# Patient Record
Sex: Male | Born: 1985 | Race: White | Hispanic: No | Marital: Married | Smoking: Current every day smoker
Health system: Southern US, Community
[De-identification: ages and names within clinical notes are randomized; demographics above are authoritative.]

---
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
## 2008-04-17 ENCOUNTER — Inpatient Hospital Stay (HOSPITAL_COMMUNITY): Admission: AD | Admit: 2008-04-17 | Discharge: 2008-04-20 | Payer: Self-pay

## 2008-04-17 ENCOUNTER — Encounter: Payer: Self-pay | Admitting: Emergency Medicine

## 2008-05-18 ENCOUNTER — Encounter: Admission: RE | Admit: 2008-05-18 | Discharge: 2008-05-18 | Payer: Self-pay | Admitting: Neurological Surgery

## 2008-06-21 ENCOUNTER — Encounter: Admission: RE | Admit: 2008-06-21 | Discharge: 2008-06-21 | Payer: Self-pay | Admitting: Neurological Surgery

## 2008-07-01 ENCOUNTER — Emergency Department: Payer: Self-pay | Admitting: Internal Medicine

## 2008-08-24 ENCOUNTER — Encounter
Admission: RE | Admit: 2008-08-24 | Discharge: 2008-08-24 | Payer: Self-pay | Admitting: Physical Medicine & Rehabilitation
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
## 2011-02-09 LAB — CBC
MCHC: 34.1 g/dL (ref 30.0–36.0)
MCV: 96.3 fL (ref 78.0–100.0)
Platelets: 164 10*3/uL (ref 150–400)
Platelets: 212 10*3/uL (ref 150–400)
RBC: 4.43 MIL/uL (ref 4.22–5.81)
RDW: 12.7 % (ref 11.5–15.5)
RDW: 13.1 % (ref 11.5–15.5)
WBC: 7.7 10*3/uL (ref 4.0–10.5)

## 2011-02-09 LAB — BASIC METABOLIC PANEL
BUN: 10 mg/dL (ref 6–23)
BUN: 3 mg/dL — ABNORMAL LOW (ref 6–23)
CO2: 25 mEq/L (ref 19–32)
Calcium: 8.9 mg/dL (ref 8.4–10.5)
Calcium: 9.5 mg/dL (ref 8.4–10.5)
Creatinine, Ser: 0.79 mg/dL (ref 0.4–1.5)
Creatinine, Ser: 0.88 mg/dL (ref 0.4–1.5)
GFR calc Af Amer: >60 mL/min (ref >60)
GFR calc non Af Amer: >60 mL/min (ref >60)
Glucose, Bld: 114 mg/dL — ABNORMAL HIGH (ref 70–99)

## 2011-02-09 LAB — URINALYSIS, ROUTINE W REFLEX MICROSCOPIC
Bilirubin Urine: NEGATIVE
Nitrite: NEGATIVE
Specific Gravity, Urine: <1.005 — ABNORMAL LOW (ref 1.005–1.030)
pH: 6 (ref 5.0–8.0)

## 2011-02-09 LAB — DIFFERENTIAL
Basophils Absolute: 0 10*3/uL (ref 0.0–0.1)
Basophils Relative: 0 % (ref 0–1)
Eosinophils Absolute: 0 10*3/uL (ref 0.0–0.7)
Monocytes Relative: 4 % (ref 3–12)
Neutro Abs: 16.5 10*3/uL — ABNORMAL HIGH (ref 1.7–7.7)
Neutrophils Relative %: 91 % — ABNORMAL HIGH (ref 43–77)

## 2011-02-09 LAB — TYPE AND SCREEN
ABO/RH(D): O POS
Antibody Screen: NEGATIVE

## 2011-02-09 LAB — URINE MICROSCOPIC-ADD ON

## 2012-02-05 ENCOUNTER — Emergency Department (HOSPITAL_COMMUNITY)
Admission: EM | Admit: 2012-02-05 | Discharge: 2012-02-05 | Disposition: A | Discharge status: Home / Self Care | Payer: Self-pay | Attending: Emergency Medicine | Admitting: Emergency Medicine

## 2012-02-05 ENCOUNTER — Encounter (HOSPITAL_COMMUNITY): Payer: Self-pay | Admitting: Emergency Medicine

## 2012-02-05 DIAGNOSIS — R059 Cough, unspecified: Secondary | ICD-10-CM | POA: Insufficient documentation

## 2012-02-05 DIAGNOSIS — J028 Acute pharyngitis due to other specified organisms: Secondary | ICD-10-CM

## 2012-02-05 DIAGNOSIS — J029 Acute pharyngitis, unspecified: Secondary | ICD-10-CM | POA: Insufficient documentation

## 2012-02-05 DIAGNOSIS — R05 Cough: Secondary | ICD-10-CM

## 2012-02-05 DIAGNOSIS — B9789 Other viral agents as the cause of diseases classified elsewhere: Secondary | ICD-10-CM | POA: Insufficient documentation

## 2012-02-05 LAB — RAPID STREP SCREEN (MED CTR MEBANE ONLY): Streptococcus, Group A Screen (Direct): NEGATIVE

## 2012-02-05 MED ORDER — AZITHROMYCIN 250 MG PO TABS: ORAL_TABLET | ORAL | Status: DC

## 2012-02-05 NOTE — ED Notes (Signed)
Pt alert, arrives from home, c/o sore throat, fever, body aches, onset last week, resp even unlabored, skin pwd

## 2012-02-05 NOTE — ED Provider Notes (Signed)
History     CSN: [id number]  Arrival date & time 02/05/12  2048   First MD Initiated Contact with Patient 02/05/12 2207      Chief Complaint  Patient presents with  . Sore Throat    (Consider location/radiation/quality/duration/timing/severity/associated sxs/prior treatment) HPI Comments: Jared Lawson 26 y.o. male   The chief complaint is: Patient presents with:   Sore Throat   The patient has medical history significant for:   History reviewed. No pertinent past medical history.  Patient presents with a one week history of unproductive cough, sore throat, myalgias, and subjective fever X 1 week. Patient states that a small child in his household was recently diagnosed with strep and bronchitis. Patient states that he has tried OTC throat lozenges, tylenol, and Dayquil without relief of symptoms. Denies chills or diaphoresis. Denies SOB or wheezing.      The history is provided by the patient. No language interpreter was used.    History reviewed. No pertinent past medical history.  History reviewed. No pertinent past surgical history.  No family history on file.  History  Substance Use Topics  . Smoking status: Current Some Day Smoker  . Smokeless tobacco: Not on file  . Alcohol Use: No      Review of Systems  Constitutional: Positive for fever. Negative for chills and diaphoresis.  HENT: Positive for sore throat.   Respiratory: Positive for cough. Negative for shortness of breath and wheezing.   Musculoskeletal: Positive for myalgias.    Allergies  Review of patient's allergies indicates no known allergies.  Home Medications   Current Outpatient Rx  Name Route Sig Dispense Refill  . HEXYLRESORCINOL 2.4 MG MT LOZG Mouth/Throat Use as directed 1 lozenge in the mouth or throat every 4 (four) hours as needed. For sore throat    . TYLENOL COLD HEAD CONGESTION PO Oral Take 2 tablets by mouth every 6 (six) hours as needed. For pain    . DAYQUIL PO Oral  Take 2 capsules by mouth every 6 (six) hours as needed. For cold symptoms      BP 124/72  Pulse 89  Temp 99 F (37.2 C) (Oral)  Resp 16  Wt 190 lb (86.183 kg)  SpO2 99%  Physical Exam  Nursing note and vitals reviewed. Constitutional: He appears well-developed and well-nourished. No distress.  HENT:  Head: Normocephalic and atraumatic.  Mouth/Throat: Oropharynx is clear and moist. No oropharyngeal exudate.       Oropharnyx mildly erythematous with signs of post nasal drip.   No tenderness to palpation of frontal or maxillary sinuses.  Eyes: Conjunctivae normal and EOM are normal. No scleral icterus.  Neck: Normal range of motion. Neck supple.  Cardiovascular: Normal rate and regular rhythm.   Pulmonary/Chest: Effort normal and breath sounds normal. He has no wheezes.  Abdominal: Soft. Bowel sounds are normal. There is no tenderness.  Lymphadenopathy:    He has no cervical adenopathy.  Neurological: He is alert.  Skin: Skin is warm and dry.    ED Course  Procedures (including critical care time)   Labs Reviewed  RAPID STREP SCREEN   Results for orders placed during the hospital encounter of 02/05/12  RAPID STREP SCREEN      Component Value Range   Streptococcus, Group A Screen (Direct) NEGATIVE  NEGATIVE    No results found.   1. Sore throat (viral)   2. Cough       MDM  Patient presented with sore throat, subjective fevers,  cough, congestion, and body aches x 1 week. Patient afebrile. Patient discharged on z-pak, as it has been more than one week. Patient also given instructions on supportive care for his sore throat. No red flags for strep pharyngitis or peritonsillar abscess. Return precautions given verbally and in discharge summary.        Pixie Casino, PA-C 02/06/12 [phone]

## 2012-02-06 NOTE — ED Provider Notes (Signed)
Medical screening examination/treatment/procedure(s) were performed by non-physician practitioner and as supervising physician I was immediately available for consultation/collaboration. Devoria Albe, MD, Armando Gang   Ward Givens, MD 02/06/12 [phone]

## 2015-01-11 ENCOUNTER — Encounter: Payer: Self-pay | Admitting: *Deleted

## 2015-01-11 ENCOUNTER — Emergency Department
Admission: EM | Admit: 2015-01-11 | Discharge: 2015-01-12 | Disposition: A | Discharge status: Home / Self Care | Payer: Self-pay | Attending: Emergency Medicine | Admitting: Emergency Medicine

## 2015-01-11 DIAGNOSIS — S91331A Puncture wound without foreign body, right foot, initial encounter: Secondary | ICD-10-CM | POA: Insufficient documentation

## 2015-01-11 DIAGNOSIS — S31139A Puncture wound of abdominal wall without foreign body, unspecified quadrant without penetration into peritoneal cavity, initial encounter: Secondary | ICD-10-CM | POA: Insufficient documentation

## 2015-01-11 DIAGNOSIS — L03314 Cellulitis of groin: Secondary | ICD-10-CM

## 2015-01-11 DIAGNOSIS — W5911XA Bitten by nonvenomous snake, initial encounter: Secondary | ICD-10-CM | POA: Insufficient documentation

## 2015-01-11 DIAGNOSIS — T63001A Toxic effect of unspecified snake venom, accidental (unintentional), initial encounter: Secondary | ICD-10-CM

## 2015-01-11 DIAGNOSIS — Y9301 Activity, walking, marching and hiking: Secondary | ICD-10-CM | POA: Insufficient documentation

## 2015-01-11 DIAGNOSIS — Z720 Tobacco use: Secondary | ICD-10-CM | POA: Insufficient documentation

## 2015-01-11 DIAGNOSIS — Y998 Other external cause status: Secondary | ICD-10-CM | POA: Insufficient documentation

## 2015-01-11 DIAGNOSIS — S9001XA Contusion of right ankle, initial encounter: Secondary | ICD-10-CM | POA: Insufficient documentation

## 2015-01-11 DIAGNOSIS — Z23 Encounter for immunization: Secondary | ICD-10-CM | POA: Insufficient documentation

## 2015-01-11 DIAGNOSIS — Y9289 Other specified places as the place of occurrence of the external cause: Secondary | ICD-10-CM | POA: Insufficient documentation

## 2015-01-11 MED ORDER — ONDANSETRON HCL 4 MG/2ML IJ SOLN
INTRAMUSCULAR | Status: AC
  Administered 2015-01-11: 4 mg via INTRAVENOUS
  Filled 2015-01-11: qty 2

## 2015-01-11 MED ORDER — MORPHINE SULFATE (PF) 2 MG/ML IV SOLN
2.0000 mg | Freq: Once | INTRAVENOUS | Status: AC
  Administered 2015-01-11: 2 mg via INTRAVENOUS

## 2015-01-11 MED ORDER — SODIUM CHLORIDE 0.9 % IV BOLUS (SEPSIS)
1000.0000 mL | Freq: Once | INTRAVENOUS | Status: AC
Start: 2015-01-11 — End: 2015-01-12
  Administered 2015-01-11: 1000 mL via INTRAVENOUS

## 2015-01-11 MED ORDER — ONDANSETRON HCL 4 MG/2ML IJ SOLN
4.0000 mg | Freq: Once | INTRAMUSCULAR | Status: AC
  Administered 2015-01-11: 4 mg via INTRAVENOUS

## 2015-01-11 MED ORDER — SULFAMETHOXAZOLE-TRIMETHOPRIM 800-160 MG PO TABS
1.0000 | ORAL_TABLET | Freq: Once | ORAL | Status: AC
  Administered 2015-01-11: 1 via ORAL
  Filled 2015-01-11: qty 1

## 2015-01-11 MED ORDER — MORPHINE SULFATE (PF) 2 MG/ML IV SOLN
INTRAVENOUS | Status: AC
  Administered 2015-01-11: 2 mg via INTRAVENOUS
  Filled 2015-01-11: qty 1
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
## 2015-01-11 NOTE — ED Notes (Signed)
Dr Manson Passey at bedside, explaining plan of care.  Pt and SO verbalized understanding.

## 2015-01-11 NOTE — ED Notes (Signed)
Initial Measuring of Right Foot:  Calf 15  Ankle 11 1/2  Ankle around heel 12  Foot 10

## 2015-01-12 ENCOUNTER — Emergency Department
Admission: EM | Admit: 2015-01-12 | Discharge: 2015-01-13 | Disposition: A | Discharge status: Home / Self Care | Payer: Self-pay | Attending: Emergency Medicine | Admitting: Emergency Medicine

## 2015-01-12 ENCOUNTER — Encounter: Payer: Self-pay | Admitting: Emergency Medicine

## 2015-01-12 DIAGNOSIS — Y998 Other external cause status: Secondary | ICD-10-CM | POA: Insufficient documentation

## 2015-01-12 DIAGNOSIS — Y9389 Activity, other specified: Secondary | ICD-10-CM | POA: Insufficient documentation

## 2015-01-12 DIAGNOSIS — S9031XA Contusion of right foot, initial encounter: Secondary | ICD-10-CM | POA: Insufficient documentation

## 2015-01-12 DIAGNOSIS — W5911XA Bitten by nonvenomous snake, initial encounter: Secondary | ICD-10-CM | POA: Insufficient documentation

## 2015-01-12 DIAGNOSIS — Z79899 Other long term (current) drug therapy: Secondary | ICD-10-CM | POA: Insufficient documentation

## 2015-01-12 DIAGNOSIS — Y9289 Other specified places as the place of occurrence of the external cause: Secondary | ICD-10-CM | POA: Insufficient documentation

## 2015-01-12 DIAGNOSIS — T63001D Toxic effect of unspecified snake venom, accidental (unintentional), subsequent encounter: Secondary | ICD-10-CM

## 2015-01-12 DIAGNOSIS — S91331D Puncture wound without foreign body, right foot, subsequent encounter: Secondary | ICD-10-CM | POA: Insufficient documentation

## 2015-01-12 DIAGNOSIS — Z720 Tobacco use: Secondary | ICD-10-CM | POA: Insufficient documentation

## 2015-01-12 LAB — APTT: aPTT: 32 seconds (ref 24–36)

## 2015-01-12 LAB — COMPREHENSIVE METABOLIC PANEL
ALT: 28 U/L (ref 17–63)
ANION GAP: 11 (ref 5–15)
AST: 27 U/L (ref 15–41)
Albumin: 5.3 g/dL — ABNORMAL HIGH (ref 3.5–5.0)
Alkaline Phosphatase: 99 U/L (ref 38–126)
BUN: 12 mg/dL (ref 6–20)
CALCIUM: 9.9 mg/dL (ref 8.9–10.3)
CHLORIDE: 100 mmol/L — AB (ref 101–111)
CO2: 30 mmol/L (ref 22–32)
CREATININE: 1.06 mg/dL (ref 0.61–1.24)
Glucose, Bld: 106 mg/dL — ABNORMAL HIGH (ref 65–99)
Potassium: 3.9 mmol/L (ref 3.5–5.1)
SODIUM: 141 mmol/L (ref 135–145)
Total Bilirubin: 1 mg/dL (ref 0.3–1.2)
Total Protein: 8.7 g/dL — ABNORMAL HIGH (ref 6.5–8.1)

## 2015-01-12 LAB — CBC
HCT: 48.3 % (ref 40.0–52.0)
Hemoglobin: 16.4 g/dL (ref 13.0–18.0)
MCH: 32.5 pg (ref 26.0–34.0)
MCHC: 33.9 g/dL (ref 32.0–36.0)
MCV: 95.8 fL (ref 80.0–100.0)
PLATELETS: 262 10*3/uL (ref 150–440)
RBC: 5.04 MIL/uL (ref 4.40–5.90)
RDW: 13.2 % (ref 11.5–14.5)
WBC: 9.9 10*3/uL (ref 3.8–10.6)

## 2015-01-12 MED ORDER — MORPHINE SULFATE (PF) 4 MG/ML IV SOLN
4.0000 mg | Freq: Once | INTRAVENOUS | Status: AC
  Administered 2015-01-12: 4 mg via INTRAVENOUS
  Filled 2015-01-12: qty 1

## 2015-01-12 MED ORDER — MORPHINE SULFATE (PF) 2 MG/ML IV SOLN
2.0000 mg | Freq: Once | INTRAVENOUS | Status: AC
  Administered 2015-01-12: 2 mg via INTRAVENOUS

## 2015-01-12 MED ORDER — TETANUS-DIPHTHERIA TOXOIDS TD 5-2 LFU IM INJ
0.5000 mL | INJECTION | Freq: Once | INTRAMUSCULAR | Status: AC
  Administered 2015-01-12: 0.5 mL via INTRAMUSCULAR
  Filled 2015-01-12: qty 0.5

## 2015-01-12 MED ORDER — NICOTINE 21 MG/24HR TD PT24
21.0000 mg | MEDICATED_PATCH | Freq: Once | TRANSDERMAL | Status: DC
  Administered 2015-01-12: 21 mg via TRANSDERMAL
  Filled 2015-01-12: qty 1

## 2015-01-12 MED ORDER — OXYCODONE-ACETAMINOPHEN 10-325 MG PO TABS: 1.0000 | ORAL_TABLET | Freq: Four times a day (QID) | ORAL | Status: AC | PRN

## 2015-01-12 MED ORDER — KETOROLAC TROMETHAMINE 30 MG/ML IJ SOLN
30.0000 mg | Freq: Once | INTRAMUSCULAR | Status: AC
  Administered 2015-01-12: 30 mg via INTRAVENOUS
  Filled 2015-01-12: qty 1

## 2015-01-12 MED ORDER — MORPHINE SULFATE (PF) 2 MG/ML IV SOLN
INTRAVENOUS | Status: AC
  Administered 2015-01-12: 2 mg via INTRAVENOUS
  Filled 2015-01-12: qty 1

## 2015-01-12 MED ORDER — OXYCODONE-ACETAMINOPHEN 5-325 MG PO TABS
1.0000 | ORAL_TABLET | Freq: Once | ORAL | Status: AC
  Administered 2015-01-12: 1 via ORAL
  Filled 2015-01-12: qty 1

## 2015-01-12 MED ORDER — OXYCODONE-ACETAMINOPHEN 5-325 MG PO TABS: 1.0000 | ORAL_TABLET | ORAL | Status: DC | PRN

## 2015-01-12 MED ORDER — HYDROMORPHONE HCL 1 MG/ML IJ SOLN
1.0000 mg | Freq: Once | INTRAMUSCULAR | Status: AC
  Administered 2015-01-12: 1 mg via INTRAVENOUS
  Filled 2015-01-12: qty 1

## 2015-01-12 MED ORDER — SULFAMETHOXAZOLE-TRIMETHOPRIM 800-160 MG PO TABS: 1.0000 | ORAL_TABLET | Freq: Two times a day (BID) | ORAL | Status: AC

## 2015-01-12 NOTE — Discharge Instructions (Signed)
Cellulitis Cellulitis is an infection of the skin and the tissue beneath it. The infected area is usually red and tender. Cellulitis occurs most often in the arms and lower legs.  CAUSES  Cellulitis is caused by bacteria that enter the skin through cracks or cuts in the skin. The most common types of bacteria that cause cellulitis are staphylococci and streptococci. SIGNS AND SYMPTOMS   Redness and warmth.  Swelling.  Tenderness or pain.  Fever. DIAGNOSIS  Your health care provider can usually determine what is wrong based on a physical exam. Blood tests may also be done. TREATMENT  Treatment usually involves taking an antibiotic medicine. HOME CARE INSTRUCTIONS   Take your antibiotic medicine as directed by your health care provider. Finish the antibiotic even if you start to feel better.  Keep the infected arm or leg elevated to reduce swelling.  Apply a warm cloth to the affected area up to 4 times per day to relieve pain.  Take medicines only as directed by your health care provider.  Keep all follow-up visits as directed by your health care provider. SEEK MEDICAL CARE IF:   You notice red streaks coming from the infected area.  Your red area gets larger or turns dark in color.  Your bone or joint underneath the infected area becomes painful after the skin has healed.  Your infection returns in the same area or another area.  You notice a swollen bump in the infected area.  You develop Shelden symptoms.  You have a fever. SEEK IMMEDIATE MEDICAL CARE IF:   You feel very sleepy.  You develop vomiting or diarrhea.  You have a general ill feeling (malaise) with muscle aches and pains. MAKE SURE YOU:   Understand these instructions.  Will watch your condition.  Will get help right away if you are not doing well or get worse. Document Released: 01/31/2005 Document Revised: 09/07/2013 Document Reviewed: 07/09/2011 [facility] Patient Information 2015 [place], [place].  This information is not intended to replace advice given to you by your health care provider. Make sure you discuss any questions you have with your health care provider.  Snake Bite Snakes may be either venomous (containing poison) or nonvenomous (nonpoisonous). A nonvenomous snake bite will cause trauma or a wound to the skin and possibly the deeper tissues. A venomous snake will also cause a traumatic wound, but more importantly, it may have injected venom into the wound. Snake bite venom can be extremely serious and even deadly. One type of venom may cause major skin, tissue and muscle damage, and failure of normal blood clotting. This may cause extreme swelling and pain of the affected area. Another type of venom can affect the brain and nervous system and may cause death. The treatment for venomous snake bite may require the use of antivenom medicine. If you are unsure if your bite is from a venomous snake, you MUST seek immediate medical attention. YOU MIGHT NEED A TETANUS SHOT NOW IF:  You have no idea when you had the last one.  You have never had a tetanus shot before.  The bite broke your skin. If you need a tetanus shot, and you decide not to get one, there is a rare chance of getting tetanus. Sickness from tetanus can be serious. HOME CARE INSTRUCTIONS  A snake bit you and caused a skin wound. It may or may not have been venomous. If the snake was venomous, a small amount of venom may have been injected into your skin.  Keep the bite area clean and dry.  Keep the extremity elevated above the level of the heart for the next 48 hours.  Wash the bite area 3 times daily with soap and water or an antiseptic. Apply an adhesive or gauze bandage to the bite area.  If you develop blistering of any type at the site of the bite, protect the blisters from breaking. Do not attempt to open it.  If you were given a tetanus shot, your arm may get swollen, red and warm at the shot site. This is a  common response to the injection. SEEK IMMEDIATE MEDICAL CARE IF:   You develop symptoms of poisoning including increased pain, redness, swelling, blood blisters or purple spots in the bite area, nausea, vomiting, numbness, tingling, excessive sweating, breathing difficulty, blurred vision, feelings of lightheadedness, or feeling faint. If you develop symptoms of poisoning, you MUST seek immediate medical attention.  The bite becomes infected. Symptoms may include redness, swelling, pain, tenderness, pus, red streaks running from the wound, or an oral temperature above 102 F (38.9 C), not controlled by medicine.  Your condition or wound becomes worse. MAKE SURE YOU:   Understand these instructions.  Will watch your condition.  Will get help right away if you are not doing well or get worse. Document Released: 04/20/2000 Document Revised: 07/16/2011 Document Reviewed: 09/14/2009 [facility] Patient Information 2015 [place], [place]. This information is not intended to replace advice given to you by your health care provider. Make sure you discuss any questions you have with your health care provider.

## 2015-01-12 NOTE — ED Provider Notes (Signed)
[facility] Emergency Department Provider Note    ____________________________________________  Time seen: 1145  I have reviewed the triage vital signs and the nursing notes.   HISTORY  Chief Complaint Snake Bite   History limited by: Not Limited   HPI Jared Lawson is a 29 y.o. male who presents to the emergency department today with concerns for pain and swelling to his right foot. The patient was seen in the emergency department roughly 24 hours ago after having a snake bite to his right foot. He was seen in the emergency department given pain medications and antibiotics to go home with. He was not given CroFab. Patient states that he has had continued pain and he thinks some increased swelling of his foot today. He denies any fevers, shortness of breath, chest pain, nausea vomiting or diarrhea.     History reviewed. No pertinent past medical history.  There are no active problems to display for this patient.   History reviewed. No pertinent past surgical history.  Current Outpatient Rx  Name  Route  Sig  Dispense  Refill  . azithromycin (ZITHROMAX Z-PAK) 250 MG tablet      2 po day one, then 1 daily x 4 days   5 tablet   0   . HEXYLRESORCINOL, ANTISEPTIC, (SUCRETS) 2.4 MG LOZG   Mouth/Throat   Use as directed 1 lozenge in the mouth or throat every 4 (four) hours as needed. For sore throat         . oxyCODONE-acetaminophen (PERCOCET/ROXICET) 5-325 MG per tablet   Oral   Take 1 tablet by mouth every 4 (four) hours as needed for severe pain.   20 tablet   0   . Phenyleph-CPM-DM-APAP (TYLENOL COLD HEAD CONGESTION PO)   Oral   Take 2 tablets by mouth every 6 (six) hours as needed. For pain         . Pseudoephedrine-APAP-DM (DAYQUIL PO)   Oral   Take 2 capsules by mouth every 6 (six) hours as needed. For cold symptoms         . sulfamethoxazole-trimethoprim (BACTRIM DS,SEPTRA DS) 800-160 MG per tablet   Oral   Take 1 tablet by  mouth 2 (two) times daily.   14 tablet   0     Allergies Review of patient's allergies indicates no known allergies.  No family history on file.  Social History Social History  Substance Use Topics  . Smoking status: Current Some Day Smoker  . Smokeless tobacco: None  . Alcohol Use: No    Review of Systems  Constitutional: Negative for fever. Cardiovascular: Negative for chest pain. Respiratory: Negative for shortness of breath. Gastrointestinal: Negative for abdominal pain, vomiting and diarrhea. Genitourinary: Negative for dysuria. Musculoskeletal: Positive for right foot pain and swelling. Skin: Negative for rash. Neurological: Negative for headaches, focal weakness or numbness.   10-point ROS otherwise negative.  ____________________________________________   PHYSICAL EXAM:  VITAL SIGNS: ED Triage Vitals  Enc Vitals Group     BP 01/12/15 2316 137/55 mmHg     Pulse Rate 01/12/15 2316 98     Resp 01/12/15 2316 18     Temp --      Temp Source 01/12/15 2316 Oral     SpO2 01/12/15 2316 98 %     Weight 01/12/15 2316 170 lb (77.111 kg)     Height 01/12/15 2316  (1.803 m)     Head Cir --      Peak Flow --  Pain Score 01/12/15 2316 8   Constitutional: Alert and oriented. Well appearing and in no distress. Eyes: Conjunctivae are normal. PERRL. Normal extraocular movements. ENT   Head: Normocephalic and atraumatic.   Nose: No congestion/rhinnorhea.   Mouth/Throat: Mucous membranes are moist.   Neck: No stridor. Hematological/Lymphatic/Immunilogical: No cervical lymphadenopathy. Cardiovascular: Normal rate, regular rhythm.  No murmurs, rubs, or gallops. Respiratory: Normal respiratory effort without tachypnea nor retractions. Breath sounds are clear and equal bilaterally. No wheezes/rales/rhonchi. Gastrointestinal: Soft and nontender. No distention.  Genitourinary: Deferred Musculoskeletal: Patient with small area of ecchymosis to the  mid right instep. It is tender to palpation. No discharge or obvious puncture wounds appreciated. Compartments are soft in the right lower leg. Neurologic:  Normal speech and language. No gross focal neurologic deficits are appreciated. Speech is normal.  Skin:  Skin is warm, dry and intact. No rash noted. Psychiatric: Mood and affect are normal. Speech and behavior are normal. Patient exhibits appropriate insight and judgment.  ____________________________________________    LABS (pertinent positives/negatives)  None  ____________________________________________   EKG  None  ____________________________________________    RADIOLOGY  None  ____________________________________________   PROCEDURES  Procedure(s) performed: None  Critical Care performed: No  ____________________________________________   INITIAL IMPRESSION / ASSESSMENT AND PLAN / ED COURSE  Pertinent labs & imaging results that were available during my care of the patient were reviewed by me and considered in my medical decision making (see chart for details).  Patient presents to the emergency department today with continued pain and swelling to the right foot after a snake bite yesterday. On exam patient with mild ecchymosis. Compartments are soft no signs of tissue necrosis. No signs of overlying infection. At this point I think continued treatment with pain medication and antibiotics.  ____________________________________________   FINAL CLINICAL IMPRESSION(S) / ED DIAGNOSES  Snake Bite  Phineas Semen, MD 01/12/15 [id number]

## 2015-01-12 NOTE — ED Provider Notes (Signed)
[facility] Emergency Department Provider Note  ____________________________________________  Time seen: 11:00 PM  I have reviewed the triage vital signs and the nursing notes.   HISTORY  Chief Complaint Snake Bite     HPI Jared Lawson is a 29 y.o. male presents with history of possible snake bite to the right foot. Patient states roughly 30 minutes before presenting to the emergency department while walking outside [employer] he felt a sharp pain to the medial posterior aspect of his right heel. Since that time the patient states that he has noted markedly swollen and ecchymoses. Current pain score is 10 out of 10.Patient currently denies any nausea vomiting dizziness no chest pain no shortness of breath.     Past medical history None  There are no active problems to display for this patient.   Past surgical history None  Current Outpatient Rx  Name  Route  Sig  Dispense  Refill  . azithromycin (ZITHROMAX Z-PAK) 250 MG tablet      2 po day one, then 1 daily x 4 days   5 tablet   0   . HEXYLRESORCINOL, ANTISEPTIC, (SUCRETS) 2.4 MG LOZG   Mouth/Throat   Use as directed 1 lozenge in the mouth or throat every 4 (four) hours as needed. For sore throat         . Phenyleph-CPM-DM-APAP (TYLENOL COLD HEAD CONGESTION PO)   Oral   Take 2 tablets by mouth every 6 (six) hours as needed. For pain         . Pseudoephedrine-APAP-DM (DAYQUIL PO)   Oral   Take 2 capsules by mouth every 6 (six) hours as needed. For cold symptoms           Allergies No known drug allergies No family history on file.  Social History Social History  Substance Use Topics  . Smoking status: Current Some Day Smoker  . Smokeless tobacco: None  . Alcohol Use: No    Review of Systems  Constitutional: Negative for fever. Eyes: Negative for visual changes. ENT: Negative for sore throat. Cardiovascular: Negative for chest pain. Respiratory: Negative for shortness  of breath. Gastrointestinal: Negative for abdominal pain, vomiting and diarrhea. Genitourinary: Negative for dysuria. Musculoskeletal: Negative for back pain. Positive for right foot pain and swelling. Skin: Negative for rash. Neurological: Negative for headaches, focal weakness or numbness.   10-point ROS otherwise negative.  ____________________________________________   PHYSICAL EXAM:  VITAL SIGNS: ED Triage Vitals  Enc Vitals Group     BP 01/11/15 2241 115/77 mmHg     Pulse Rate 01/11/15 2241 107     Resp 01/11/15 2241 20     Temp 01/11/15 2241 98.1 F (36.7 C)     Temp Source 01/11/15 2241 Oral     SpO2 01/11/15 2241 99 %     Weight 01/11/15 2241 170 lb (77.111 kg)     Height 01/11/15 2241  (1.803 m)     Head Cir --      Peak Flow --      Pain Score 01/11/15 2242 8     Pain Loc --      Pain Edu? --      Excl. in GC? --     * Constitutional: Alert and oriented. Well appearing and in no distress. Eyes: Conjunctivae are normal. PERRL. Normal extraocular movements. ENT   Head: Normocephalic and atraumatic.   Nose: No congestion/rhinnorhea.   Mouth/Throat: Mucous membranes are moist.   Neck: No  stridor. Hematological/Lymphatic/Immunilogical: No cervical lymphadenopathy. Cardiovascular: Normal rate, regular rhythm. Normal and symmetric distal pulses are present in all extremities. No murmurs, rubs, or gallops. Respiratory: Normal respiratory effort without tachypnea nor retractions. Breath sounds are clear and equal bilaterally. No wheezes/rales/rhonchi. Gastrointestinal: Soft and nontender. No distention. There is no CVA tenderness. Genitourinary: deferred Musculoskeletal: Pain with palpation, ecchymoses and swelling noted to the medial aspect of the right heel. No joint effusions.  No lower extremity tenderness nor edema. Neurologic:  Normal speech and language. No gross focal neurologic deficits are appreciated. Speech is normal.  Skin:  Small 2 x  2 centimeter area of erythema left groin. Right medial calcaneal erythema and ecchymoses with overlying swelling extending to the ankle. Psychiatric: Mood and affect are normal. Speech and behavior are normal. Patient exhibits appropriate insight and judgment.  ____________________________________________    LABS (pertinent positives/negatives)  Labs Reviewed  COMPREHENSIVE METABOLIC PANEL - Abnormal; Notable for the following:    Chloride 100 (*)    Glucose, Bld 106 (*)    Total Protein 8.7 (*)    Albumin 5.3 (*)    All other components within normal limits  CBC  APTT       INITIAL IMPRESSION / ASSESSMENT AND PLAN / ED COURSE  Pertinent labs & imaging results that were available during my care of the patient were reviewed by me and considered in my medical decision making (see chart for details).  She received multiple doses of IV morphine in emergency department with pain control. Of note patient swelling did not progress in fact area around the ankle actually decreased in size while in the emergency department. I reviewed the patient's labs which were unremarkable from the standpoint of a snake bite. Patient was advised to return to emergency department for any increased swelling pain nausea vomiting or any other emergency medical concerns.  ____________________________________________   FINAL CLINICAL IMPRESSION(S) / ED DIAGNOSES  Final diagnoses:  Snake bite, accidental or unintentional, initial encounter  Cellulitis of groin      Darci Current, MD 01/14/15 [id number]

## 2015-01-12 NOTE — ED Notes (Signed)
Measure of foot: Ankle 11 3/8 Ankle around heel over top of foot 13 Foot circumference at arch 10

## 2015-01-12 NOTE — ED Notes (Signed)
Dr Manson Passey at bedside, reassessing R foot.

## 2015-01-12 NOTE — ED Notes (Addendum)
Pt to triage via w/c with no distress noted; pt seen here last night for snake bite; c/o increased swelling to right foot/ankle and lower leg; discoloration noted to instep of foot with increased pain; +PP, as compared to measurements noted on leg from last night, approx 1/2" larger circumference noted around calf and ankle (10" mid foot, 12" across top of foot, 12" ankle and 15.5" to calf); pt denies any other c/o--denies CP and SOB

## 2015-01-12 NOTE — Discharge Instructions (Signed)
Please seek medical attention for any high fevers, chest pain, shortness of breath, change in behavior, persistent vomiting, bloody stool or any other [place] or concerning symptoms. ° ° °Snake Bite °Snakes may be either venomous (containing poison) or nonvenomous (nonpoisonous). A nonvenomous snake bite will cause trauma or a wound to the skin and possibly the deeper tissues. A venomous snake will also cause a traumatic wound, but more importantly, it may have injected venom into the wound. Snake bite venom can be extremely serious and even deadly. One type of venom may cause major skin, tissue and muscle damage, and failure of normal blood clotting. This may cause extreme swelling and pain of the affected area. Another type of venom can affect the brain and nervous system and may cause death. The treatment for venomous snake bite may require the use of antivenom medicine. If you are unsure if your bite is from a venomous snake, you MUST seek immediate medical attention. °YOU MIGHT NEED A TETANUS SHOT NOW IF: °· You have no idea when you had the last one. °· You have never had a tetanus shot before. °· The bite broke your skin. °If you need a tetanus shot, and you decide not to get one, there is a rare chance of getting tetanus. Sickness from tetanus can be serious. °HOME CARE INSTRUCTIONS  °A snake bit you and caused a skin wound. It may or may not have been venomous. If the snake was venomous, a small amount of venom may have been injected into your skin. °· Keep the bite area clean and dry. °· Keep the extremity elevated above the level of the heart for the next 48 hours. °· Wash the bite area 3 times daily with soap and water or an antiseptic. Apply an adhesive or gauze bandage to the bite area. °· If you develop blistering of any type at the site of the bite, protect the blisters from breaking. Do not attempt to open it. °· If you were given a tetanus shot, your arm may get swollen, red and warm at the shot site.  This is a common response to the injection. °SEEK IMMEDIATE MEDICAL CARE IF:  °· You develop symptoms of poisoning including increased pain, redness, swelling, blood blisters or purple spots in the bite area, nausea, vomiting, numbness, tingling, excessive sweating, breathing difficulty, blurred vision, feelings of lightheadedness, or feeling faint. If you develop symptoms of poisoning, you MUST seek immediate medical attention. °· The bite becomes infected. Symptoms may include redness, swelling, pain, tenderness, pus, red streaks running from the wound, or an oral temperature above 102° F (38.9° C), not controlled by medicine. °· Your condition or wound becomes worse. °MAKE SURE YOU:  °· Understand these instructions. °· Will watch your condition. °· Will get help right away if you are not doing well or get worse. °Document Released: 04/20/2000 Document Revised: 07/16/2011 Document Reviewed: 09/14/2009 °ExitCare® Patient Information ©2015 [employer]. This information is not intended to replace advice given to you by your health care provider. Make sure you discuss any questions you have with your health care provider. ° °

## 2015-01-17 ENCOUNTER — Emergency Department: Payer: Self-pay

## 2015-01-17 ENCOUNTER — Encounter: Payer: Self-pay | Admitting: Emergency Medicine

## 2015-01-17 ENCOUNTER — Emergency Department
Admission: EM | Admit: 2015-01-17 | Discharge: 2015-01-17 | Disposition: A | Discharge status: Home / Self Care | Payer: Self-pay | Attending: Emergency Medicine | Admitting: Emergency Medicine

## 2015-01-17 DIAGNOSIS — R609 Edema, unspecified: Secondary | ICD-10-CM | POA: Insufficient documentation

## 2015-01-17 DIAGNOSIS — X58XXXD Exposure to other specified factors, subsequent encounter: Secondary | ICD-10-CM | POA: Insufficient documentation

## 2015-01-17 DIAGNOSIS — Z720 Tobacco use: Secondary | ICD-10-CM | POA: Insufficient documentation

## 2015-01-17 DIAGNOSIS — S8011XD Contusion of right lower leg, subsequent encounter: Secondary | ICD-10-CM | POA: Insufficient documentation

## 2015-01-17 DIAGNOSIS — T63061D Toxic effect of venom of other North and South American snake, accidental (unintentional), subsequent encounter: Secondary | ICD-10-CM | POA: Insufficient documentation

## 2015-01-17 DIAGNOSIS — T63001D Toxic effect of unspecified snake venom, accidental (unintentional), subsequent encounter: Secondary | ICD-10-CM

## 2015-01-17 LAB — COMPREHENSIVE METABOLIC PANEL
ALK PHOS: 89 U/L (ref 38–126)
ALT: 35 U/L (ref 17–63)
ANION GAP: 11 (ref 5–15)
AST: 31 U/L (ref 15–41)
Albumin: 4.6 g/dL (ref 3.5–5.0)
BILIRUBIN TOTAL: 0.9 mg/dL (ref 0.3–1.2)
BUN: 15 mg/dL (ref 6–20)
CALCIUM: 9.2 mg/dL (ref 8.9–10.3)
CO2: 28 mmol/L (ref 22–32)
CREATININE: 1.17 mg/dL (ref 0.61–1.24)
Chloride: 98 mmol/L — ABNORMAL LOW (ref 101–111)
Glucose, Bld: 104 mg/dL — ABNORMAL HIGH (ref 65–99)
Potassium: 3.9 mmol/L (ref 3.5–5.1)
SODIUM: 137 mmol/L (ref 135–145)
TOTAL PROTEIN: 7.6 g/dL (ref 6.5–8.1)

## 2015-01-17 LAB — PROTIME-INR
INR: 1.03
PROTHROMBIN TIME: 13.7 s (ref 11.4–15.0)

## 2015-01-17 LAB — CBC WITH DIFFERENTIAL/PLATELET
Basophils Absolute: 0.1 10*3/uL (ref 0–0.1)
Basophils Relative: 1 %
EOS ABS: 0.2 10*3/uL (ref 0–0.7)
Eosinophils Relative: 2 %
HCT: 46.3 % (ref 40.0–52.0)
HEMOGLOBIN: 15.7 g/dL (ref 13.0–18.0)
LYMPHS ABS: 1.4 10*3/uL (ref 1.0–3.6)
LYMPHS PCT: 18 %
MCH: 32.4 pg (ref 26.0–34.0)
MCHC: 33.8 g/dL (ref 32.0–36.0)
MCV: 95.9 fL (ref 80.0–100.0)
MONOS PCT: 5 %
Monocytes Absolute: 0.4 10*3/uL (ref 0.2–1.0)
NEUTROS PCT: 74 %
Neutro Abs: 5.9 10*3/uL (ref 1.4–6.5)
Platelets: 221 10*3/uL (ref 150–440)
RBC: 4.84 MIL/uL (ref 4.40–5.90)
RDW: 12.9 % (ref 11.5–14.5)
WBC: 7.9 10*3/uL (ref 3.8–10.6)

## 2015-01-17 LAB — CK: Total CK: 59 U/L (ref 49–397)

## 2015-01-17 MED ORDER — IBUPROFEN 800 MG PO TABS: 800.0000 mg | ORAL_TABLET | Freq: Three times a day (TID) | ORAL | Status: DC | PRN

## 2015-01-17 MED ORDER — HYDROMORPHONE HCL 1 MG/ML IJ SOLN
1.0000 mg | Freq: Once | INTRAMUSCULAR | Status: AC
  Administered 2015-01-17: 1 mg via INTRAVENOUS

## 2015-01-17 MED ORDER — HYDROMORPHONE HCL 1 MG/ML IJ SOLN
1.0000 mg | Freq: Once | INTRAMUSCULAR | Status: AC
  Administered 2015-01-17: 1 mg via INTRAVENOUS
  Filled 2015-01-17: qty 1

## 2015-01-17 MED ORDER — OXYCODONE-ACETAMINOPHEN 5-325 MG PO TABS: 1.0000 | ORAL_TABLET | Freq: Four times a day (QID) | ORAL | Status: DC | PRN

## 2015-01-17 MED ORDER — HYDROMORPHONE HCL 1 MG/ML IJ SOLN
INTRAMUSCULAR | Status: AC
  Administered 2015-01-17: 1 mg via INTRAVENOUS
  Filled 2015-01-17: qty 1

## 2015-01-17 NOTE — Discharge Instructions (Signed)
Snake Bite °Snakes may be either venomous (containing poison) or nonvenomous (nonpoisonous). A nonvenomous snake bite will cause trauma or a wound to the skin and possibly the deeper tissues. A venomous snake will also cause a traumatic wound, but more importantly, it may have injected venom into the wound. Snake bite venom can be extremely serious and even deadly. One type of venom may cause major skin, tissue and muscle damage, and failure of normal blood clotting. This may cause extreme swelling and pain of the affected area. Another type of venom can affect the brain and nervous system and may cause death. The treatment for venomous snake bite may require the use of antivenom medicine. If you are unsure if your bite is from a venomous snake, you MUST seek immediate medical attention. °YOU MIGHT NEED A TETANUS SHOT NOW IF: °· You have no idea when you had the last one. °· You have never had a tetanus shot before. °· The bite broke your skin. °If you need a tetanus shot, and you decide not to get one, there is a rare chance of getting tetanus. Sickness from tetanus can be serious. °HOME CARE INSTRUCTIONS  °A snake bit you and caused a skin wound. It may or may not have been venomous. If the snake was venomous, a small amount of venom may have been injected into your skin. °· Keep the bite area clean and dry. °· Keep the extremity elevated above the level of the heart for the next 48 hours. °· Wash the bite area 3 times daily with soap and water or an antiseptic. Apply an adhesive or gauze bandage to the bite area. °· If you develop blistering of any type at the site of the bite, protect the blisters from breaking. Do not attempt to open it. °· If you were given a tetanus shot, your arm may get swollen, red and warm at the shot site. This is a common response to the injection. °SEEK IMMEDIATE MEDICAL CARE IF:  °· You develop symptoms of poisoning including increased pain, redness, swelling, blood blisters or purple  spots in the bite area, nausea, vomiting, numbness, tingling, excessive sweating, breathing difficulty, blurred vision, feelings of lightheadedness, or feeling faint. If you develop symptoms of poisoning, you MUST seek immediate medical attention. °· The bite becomes infected. Symptoms may include redness, swelling, pain, tenderness, pus, red streaks running from the wound, or an oral temperature above 102° F (38.9° C), not controlled by medicine. °· Your condition or wound becomes worse. °MAKE SURE YOU:  °· Understand these instructions. °· Will watch your condition. °· Will get help right away if you are not doing well or get worse. °Document Released: 04/20/2000 Document Revised: 07/16/2011 Document Reviewed: 09/14/2009 °ExitCare® Patient Information ©2015 [employer]. This information is not intended to replace advice given to you by your health care provider. Make sure you discuss any questions you have with your health care provider. ° °

## 2015-01-17 NOTE — ED Provider Notes (Addendum)
[facility] Emergency Department Provider Note     Time seen: ----------------------------------------- 5:52 PM on 01/17/2015 -----------------------------------------    I have reviewed the triage vital signs and the nursing notes.   HISTORY  Chief Complaint Snake Bite    HPI Jared Lawson is a 29 y.o. male who presents to ER for follow-up concerning a snake bite. Patient was believed to been bitten by a copperhead on September 9. Patient did not receive antivenom and at that time, states the pain is been increasing as well as increasing rash in the right lower extremity. Patient denies any fevers or chills, has just worsening pain mainly associated with this rash in the right lower extremity. Pain is worse when he walks   History reviewed. No pertinent past medical history.  There are no active problems to display for this patient.   History reviewed. No pertinent past surgical history.  Allergies Review of patient's allergies indicates no known allergies.  Social History Social History  Substance Use Topics  . Smoking status: Current Some Day Smoker  . Smokeless tobacco: None  . Alcohol Use: No    Review of Systems Constitutional: Negative for fever. Eyes: Negative for visual changes. ENT: Negative for sore throat. Cardiovascular: Negative for chest pain. Respiratory: Negative for shortness of breath. Gastrointestinal: Negative for abdominal pain, vomiting and diarrhea. Genitourinary: Negative for dysuria. Musculoskeletal: Positive for right lower extremity pain worse around the right ankle Skin: Positive for rash and bruising to the right lower extremity Neurological: Negative for headaches, focal weakness or numbness.  10-point ROS otherwise negative.  ____________________________________________   PHYSICAL EXAM:  VITAL SIGNS: ED Triage Vitals  Enc Vitals Group     BP 01/17/15 1656 118/75 mmHg     Pulse Rate 01/17/15 1656  104     Resp 01/17/15 1656 18     Temp 01/17/15 1656 97.8 F (36.6 C)     Temp Source 01/17/15 1656 Oral     SpO2 01/17/15 1656 98 %     Weight 01/17/15 1656 170 lb (77.111 kg)     Height 01/17/15 1656 5\' 10"  (1.778 m)     Head Cir --      Peak Flow --      Pain Score 01/17/15 1657 7     Pain Loc --      Pain Edu? --      Excl. in GC? --     Constitutional: Alert and oriented. Well appearing and in no distress. Eyes: Conjunctivae are normal. PERRL. Normal extraocular movements. ENT   Head: Normocephalic and atraumatic.   Nose: No congestion/rhinnorhea.   Mouth/Throat: Mucous membranes are moist.   Neck: No stridor. Cardiovascular: Normal rate, regular rhythm. Normal and symmetric distal pulses are present in all extremities. No murmurs, rubs, or gallops. Respiratory: Normal respiratory effort without tachypnea nor retractions. Breath sounds are clear and equal bilaterally. No wheezes/rales/rhonchi. Gastrointestinal: Soft and nontender. No distention. No abdominal bruits.  Musculoskeletal: Markedly tenderness in the right lower extremity mostly around the right ankle and foot. There is edema of the right lower extremity up to just above the ankle. There is associated rash from the foot all the way to the hamstring area. Neurologic:  Normal speech and language. No gross focal neurologic deficits are appreciated. Speech is normal. No gait instability. Skin: Ecchymosis and possible purpura extending from the right anteromedial calf to the hamstring area on the right leg  ____________________________________________  ED COURSE:  Pertinent labs & imaging results  that were available during my care of the patient were reviewed by me and considered in my medical decision making (see chart for details). Patient with persistent pain status post crotalid envenomation. We'll check basic labs and coags, patient may need imaging of the right lower  extremity ____________________________________________    LABS (pertinent positives/negatives)  Labs Reviewed  COMPREHENSIVE METABOLIC PANEL - Abnormal; Notable for the following:    Chloride 98 (*)    Glucose, Bld 104 (*)    All other components within normal limits  CBC WITH DIFFERENTIAL/PLATELET  PROTIME-INR  CK    RADIOLOGY Images were viewed by me  Right foot x-ray FINDINGS: There is no evidence of fracture or dislocation. There is no evidence of arthropathy or other focal bone abnormality. Soft tissues are unremarkable.  IMPRESSION: Negative. ____________________________________________  FINAL ASSESSMENT AND PLAN  Snake bite, persistent pain and edema  Plan: Patient with labs and imaging as dictated above. Patient will be discharged with pain medication, encouraged to continue elevating the extremity. Return here for worsening or worrisome symptoms.   Emily Filbert, MD   Emily Filbert, MD 01/17/15 1906  Emily Filbert, MD 01/17/15 [phone]

## 2015-01-17 NOTE — ED Notes (Signed)
Pt was seen here on Tu for copperhead bite. Did not receive antivenom at that time. Pt back for pain med, increasing rash. States swelling has decreased.

## 2015-01-21 ENCOUNTER — Encounter: Payer: Self-pay | Admitting: Medical Oncology

## 2015-01-21 ENCOUNTER — Emergency Department
Admission: EM | Admit: 2015-01-21 | Discharge: 2015-01-21 | Disposition: A | Discharge status: Home / Self Care | Payer: Self-pay | Attending: Emergency Medicine | Admitting: Emergency Medicine

## 2015-01-21 DIAGNOSIS — Z720 Tobacco use: Secondary | ICD-10-CM | POA: Insufficient documentation

## 2015-01-21 DIAGNOSIS — T63001D Toxic effect of unspecified snake venom, accidental (unintentional), subsequent encounter: Secondary | ICD-10-CM

## 2015-01-21 DIAGNOSIS — T63021D Toxic effect of coral snake venom, accidental (unintentional), subsequent encounter: Secondary | ICD-10-CM | POA: Insufficient documentation

## 2015-01-21 DIAGNOSIS — Z792 Long term (current) use of antibiotics: Secondary | ICD-10-CM | POA: Insufficient documentation

## 2015-01-21 MED ORDER — OXYCODONE-ACETAMINOPHEN 5-325 MG PO TABS: 1.0000 | ORAL_TABLET | Freq: Four times a day (QID) | ORAL | Status: DC | PRN

## 2015-01-21 NOTE — ED Provider Notes (Signed)
[facility] Emergency Department Provider Note  ____________________________________________  Time seen: Approximately 12:24 PM  I have reviewed the triage vital signs and the nursing notes.   HISTORY  Chief Complaint Follow-up  No limitations  HPI Jared Lawson is a 29 y.o. male who presents to the ER for follow-up concerning his snakebite. He has been seen 3 for same complaint. He believed he was bitten by a copperhead on September 9. He did not receive any anti-inflammatory that time. He states that the pain is still present. Denies any fevers or chills. Pain is the most severe with weightbearing however reports pain at all times. He does report that the rash as well as the swelling is much improved. He has 2 days left of the steroids. In his here for pain medication refill.   History reviewed. No pertinent past medical history.  There are no active problems to display for this patient.   History reviewed. No pertinent past surgical history.  Current Outpatient Rx  Name  Route  Sig  Dispense  Refill  . azithromycin (ZITHROMAX Z-PAK) 250 MG tablet      2 po day one, then 1 daily x 4 days   5 tablet   0   . HEXYLRESORCINOL, ANTISEPTIC, (SUCRETS) 2.4 MG LOZG   Mouth/Throat   Use as directed 1 lozenge in the mouth or throat every 4 (four) hours as needed. For sore throat         . ibuprofen (ADVIL,MOTRIN) 800 MG tablet   Oral   Take 1 tablet (800 mg total) by mouth every 8 (eight) hours as needed.   30 tablet   0   . oxyCODONE-acetaminophen (PERCOCET) 10-325 MG per tablet   Oral   Take 1 tablet by mouth every 6 (six) hours as needed for pain.   15 tablet   0   . oxyCODONE-acetaminophen (ROXICET) 5-325 MG per tablet   Oral   Take 1 tablet by mouth every 6 (six) hours as needed.   20 tablet   0   . oxyCODONE-acetaminophen (ROXICET) 5-325 MG per tablet   Oral   Take 1 tablet by mouth every 6 (six) hours as needed for severe pain.   15  tablet   0   . Phenyleph-CPM-DM-APAP (TYLENOL COLD HEAD CONGESTION PO)   Oral   Take 2 tablets by mouth every 6 (six) hours as needed. For pain         . Pseudoephedrine-APAP-DM (DAYQUIL PO)   Oral   Take 2 capsules by mouth every 6 (six) hours as needed. For cold symptoms           Allergies Review of patient's allergies indicates no known allergies.  No family history on file.  Social History Social History  Substance Use Topics  . Smoking status: Current Some Day Smoker  . Smokeless tobacco: None  . Alcohol Use: No    Review of Systems Constitutional: No fever/chills Eyes: No visual changes. ENT: No sore throat. Cardiovascular: Denies chest pain. Respiratory: Denies shortness of breath. Gastrointestinal: No abdominal pain.  No nausea, no vomiting.  No diarrhea.  No constipation. Genitourinary: Negative for dysuria. Musculoskeletal: Negative for back pain. Pain in the right lower extremity worse in the ankle region. Skin: Negative for rash. Negative for gross edema. Negative for gross erythema. Neurological: Negative for headaches, focal weakness or numbness. Denies any numbness or tingling in distal right extremity.  10-point ROS otherwise negative.  ____________________________________________   PHYSICAL EXAM:  VITAL SIGNS: ED Triage Vitals  Enc Vitals Group     BP 01/21/15 1158 151/94 mmHg     Pulse Rate 01/21/15 1158 75     Resp 01/21/15 1158 18     Temp 01/21/15 1158 97.8 F (36.6 C)     Temp Source 01/21/15 1158 Oral     SpO2 01/21/15 1158 99 %     Weight 01/21/15 1158 170 lb (77.111 kg)     Height 01/21/15 1158  (1.778 m)     Head Cir --      Peak Flow --      Pain Score 01/21/15 1158 8     Pain Loc --      Pain Edu? --      Excl. in GC? --     Constitutional: Alert and oriented. Well appearing and in no acute distress. Eyes: Conjunctivae are normal. PERRL. EOMI. Head: Atraumatic. Nose: No congestion/rhinnorhea. Mouth/Throat:  Mucous membranes are moist.  Oropharynx non-erythematous. Neck: No stridor.   Hematological/Lymphatic/Immunilogical: No cervical lymphadenopathy. Cardiovascular: Normal rate, regular rhythm. Grossly normal heart sounds.  Good peripheral circulation. Respiratory: Normal respiratory effort.  No retractions. Lungs CTAB. Gastrointestinal: Soft and nontender. No distention. No abdominal bruits. No CVA tenderness. Musculoskeletal: Mild edema noted to medial aspect of right ankle. Mild erythema noted. Tender to palpation over affected area. Range of motion is limited by pain. Neurologic:  Normal speech and language. No gross focal neurologic deficits are appreciated. Skin:  Skin is warm, dry and intact. No rash noted. Psychiatric: Mood and affect are normal. Speech and behavior are normal.  ____________________________________________   LABS (all labs ordered are listed, but only abnormal results are displayed)  Labs Reviewed - No data to display ____________________________________________  EKG   ____________________________________________  RADIOLOGY   ____________________________________________   PROCEDURES  Procedure(s) performed: None  ____________________________________________   INITIAL IMPRESSION / ASSESSMENT AND PLAN / ED COURSE  Pertinent labs & imaging results that were available during my care of the patient were reviewed by me and considered in my medical decision making (see chart for details).  Patient is turning to the emergency room for follow-up for a snakebite. Significant improvement over last visit. He still has 2 days left of steroids which he has been advised to finish. He is out of pain medication and is here for refill. We will refill pain medication but have given patient counseling on how to transition from narcotic use to anti-inflammatory/Tylenol use. Patient understands and verbalizes understanding. Patient states he will comply with  same. ____________________________________________   FINAL CLINICAL IMPRESSION(S) / ED DIAGNOSES  Final diagnoses:  Snake bites, accidental or unintentional, subsequent encounter      Racheal Patches, PA-C 01/21/15 1257  Jeanmarie Plant, MD 01/21/15 [phone]

## 2015-01-21 NOTE — ED Notes (Signed)
Pt to ed for check up on his foot from a snake bite about a week ago states he can't put any weight on it at this time.

## 2015-01-21 NOTE — Discharge Instructions (Signed)
Snake Bite °Snakes may be either venomous (containing poison) or nonvenomous (nonpoisonous). A nonvenomous snake bite will cause trauma or a wound to the skin and possibly the deeper tissues. A venomous snake will also cause a traumatic wound, but more importantly, it may have injected venom into the wound. Snake bite venom can be extremely serious and even deadly. One type of venom may cause major skin, tissue and muscle damage, and failure of normal blood clotting. This may cause extreme swelling and pain of the affected area. Another type of venom can affect the brain and nervous system and may cause death. The treatment for venomous snake bite may require the use of antivenom medicine. If you are unsure if your bite is from a venomous snake, you MUST seek immediate medical attention. °YOU MIGHT NEED A TETANUS SHOT NOW IF: °· You have no idea when you had the last one. °· You have never had a tetanus shot before. °· The bite broke your skin. °If you need a tetanus shot, and you decide not to get one, there is a rare chance of getting tetanus. Sickness from tetanus can be serious. °HOME CARE INSTRUCTIONS  °A snake bit you and caused a skin wound. It may or may not have been venomous. If the snake was venomous, a small amount of venom may have been injected into your skin. °· Keep the bite area clean and dry. °· Keep the extremity elevated above the level of the heart for the next 48 hours. °· Wash the bite area 3 times daily with soap and water or an antiseptic. Apply an adhesive or gauze bandage to the bite area. °· If you develop blistering of any type at the site of the bite, protect the blisters from breaking. Do not attempt to open it. °· If you were given a tetanus shot, your arm may get swollen, red and warm at the shot site. This is a common response to the injection. °SEEK IMMEDIATE MEDICAL CARE IF:  °· You develop symptoms of poisoning including increased pain, redness, swelling, blood blisters or purple  spots in the bite area, nausea, vomiting, numbness, tingling, excessive sweating, breathing difficulty, blurred vision, feelings of lightheadedness, or feeling faint. If you develop symptoms of poisoning, you MUST seek immediate medical attention. °· The bite becomes infected. Symptoms may include redness, swelling, pain, tenderness, pus, red streaks running from the wound, or an oral temperature above 102° F (38.9° C), not controlled by medicine. °· Your condition or wound becomes worse. °MAKE SURE YOU:  °· Understand these instructions. °· Will watch your condition. °· Will get help right away if you are not doing well or get worse. °Document Released: 04/20/2000 Document Revised: 07/16/2011 Document Reviewed: 09/14/2009 °ExitCare® Patient Information ©2015 [employer]. This information is not intended to replace advice given to you by your health care provider. Make sure you discuss any questions you have with your health care provider. ° °

## 2015-01-21 NOTE — ED Notes (Signed)
Pt reports that he was seen here Tuesday for a snake bite, pt was not given anti venom but reports that he continues to have pain in area. Pt reports area looks much better now.

## 2015-08-04 ENCOUNTER — Encounter: Payer: Self-pay | Admitting: Emergency Medicine

## 2015-08-04 ENCOUNTER — Emergency Department
Admission: EM | Admit: 2015-08-04 | Discharge: 2015-08-04 | Disposition: A | Discharge status: Home / Self Care | Payer: Self-pay | Attending: Emergency Medicine | Admitting: Emergency Medicine

## 2015-08-04 DIAGNOSIS — T700XXA Otitic barotrauma, initial encounter: Secondary | ICD-10-CM | POA: Insufficient documentation

## 2015-08-04 DIAGNOSIS — F17200 Nicotine dependence, unspecified, uncomplicated: Secondary | ICD-10-CM | POA: Insufficient documentation

## 2015-08-04 MED ORDER — FLUTICASONE PROPIONATE 50 MCG/ACT NA SUSP: 1.0000 | Freq: Two times a day (BID) | NASAL | Status: DC

## 2015-08-04 MED ORDER — CETIRIZINE HCL 10 MG PO TABS: 10.0000 mg | ORAL_TABLET | Freq: Every day | ORAL | Status: DC

## 2015-08-04 NOTE — ED Notes (Signed)
See triage note   Left ear pain since this am  No fever or drainage

## 2015-08-04 NOTE — ED Provider Notes (Signed)
[facility] Emergency Department Provider Note  ____________________________________________  Time seen: Approximately 5:19 PM  I have reviewed the triage vital signs and the nursing notes.   HISTORY  Chief Complaint Otalgia    HPI Jared Lawson is a 30 y.o. male presents to emergency department complaining of left ear pain. Patient states that symptoms began this morning. Patient denies any tenderness to palpation on the external portion of his ear. He denies any headache, visual acuity changes, nasal congestion, sneezing, cough, sore throat. Patient has not tried any medications prior to arrival. He has no history of chronic sinusitis or allergic rhinitis.   History reviewed. No pertinent past medical history.  There are no active problems to display for this patient.   History reviewed. No pertinent past surgical history.  Current Outpatient Rx  Name  Route  Sig  Dispense  Refill  . azithromycin (ZITHROMAX Z-PAK) 250 MG tablet      2 po day one, then 1 daily x 4 days   5 tablet   0   . cetirizine (ZYRTEC) 10 MG tablet   Oral   Take 1 tablet (10 mg total) by mouth daily.   30 tablet   0   . fluticasone (FLONASE) 50 MCG/ACT nasal spray   Each Nare   Place 1 spray into both nostrils 2 (two) times daily.   16 g   0   . HEXYLRESORCINOL, ANTISEPTIC, (SUCRETS) 2.4 MG LOZG   Mouth/Throat   Use as directed 1 lozenge in the mouth or throat every 4 (four) hours as needed. For sore throat         . ibuprofen (ADVIL,MOTRIN) 800 MG tablet   Oral   Take 1 tablet (800 mg total) by mouth every 8 (eight) hours as needed.   30 tablet   0   . oxyCODONE-acetaminophen (PERCOCET) 10-325 MG per tablet   Oral   Take 1 tablet by mouth every 6 (six) hours as needed for pain.   15 tablet   0   . oxyCODONE-acetaminophen (ROXICET) 5-325 MG per tablet   Oral   Take 1 tablet by mouth every 6 (six) hours as needed.   20 tablet   0   .  oxyCODONE-acetaminophen (ROXICET) 5-325 MG per tablet   Oral   Take 1 tablet by mouth every 6 (six) hours as needed for severe pain.   15 tablet   0   . Phenyleph-CPM-DM-APAP (TYLENOL COLD HEAD CONGESTION PO)   Oral   Take 2 tablets by mouth every 6 (six) hours as needed. For pain         . Pseudoephedrine-APAP-DM (DAYQUIL PO)   Oral   Take 2 capsules by mouth every 6 (six) hours as needed. For cold symptoms           Allergies Review of patient's allergies indicates no known allergies.  No family history on file.  Social History Social History  Substance Use Topics  . Smoking status: Current Some Day Smoker  . Smokeless tobacco: None  . Alcohol Use: No     Review of Systems  Constitutional: No fever/chills Eyes: No visual changes. No discharge ENT: No sore throat. Denies nasal congestion. Positive for left ear pain. Cardiovascular: no chest pain. Respiratory: no cough. No SOB. Skin: Negative for rash. Neurological: Negative for headaches, focal weakness or numbness. 10-point ROS otherwise negative.  ____________________________________________   PHYSICAL EXAM:  VITAL SIGNS: ED Triage Vitals  Enc Vitals Group  BP 08/04/15 1648 140/68 mmHg     Pulse Rate 08/04/15 1648 83     Resp 08/04/15 1648 18     Temp 08/04/15 1648 98.1 F (36.7 C)     Temp src --      SpO2 08/04/15 1648 100 %     Weight 08/04/15 1648 175 lb (79.379 kg)     Height 08/04/15 1648 5\' 8"  (1.727 m)     Head Cir --      Peak Flow --      Pain Score 08/04/15 1649 8     Pain Loc --      Pain Edu? --      Excl. in GC? --      Constitutional: Alert and oriented. Well appearing and in no acute distress. Eyes: Conjunctivae are normal. PERRL. EOMI. Head: Atraumatic. ENT:      Ears: EACs are unremarkable bilaterally. TM on right is unremarkable. TM on left is mildly bulging with no air-fluid levels appreciated. No tenderness to palpation over the tragus.      Nose: No  congestion/rhinnorhea. Turbinates are mildly boggy.      Mouth/Throat: Mucous membranes are moist. Oropharynx is nonerythematous and nonedematous. No edema or erythema noted in oral cavity surrounding dentition. Neck: No stridor.   Hematological/Lymphatic/Immunilogical: No cervical lymphadenopathy. Cardiovascular: Normal rate, regular rhythm. Normal S1 and S2.  Good peripheral circulation. Respiratory: Normal respiratory effort without tachypnea or retractions. Lungs CTAB. Neurologic:  Normal speech and language. No gross focal neurologic deficits are appreciated.  Skin:  Skin is warm, dry and intact. No rash noted. Psychiatric: Mood and affect are normal. Speech and behavior are normal. Patient exhibits appropriate insight and judgement.   ____________________________________________   LABS (all labs ordered are listed, but only abnormal results are displayed)  Labs Reviewed - No data to display ____________________________________________  EKG   ____________________________________________  RADIOLOGY   No results found.  ____________________________________________    PROCEDURES  Procedure(s) performed:       Medications - No data to display   ____________________________________________   INITIAL IMPRESSION / ASSESSMENT AND PLAN / ED COURSE  Pertinent labs & imaging results that were available during my care of the patient were reviewed by me and considered in my medical decision making (see chart for details).  Patient's diagnosis is consistent with otitis media to the left.. Patient will be discharged home with prescriptions for Zyrtec and Flonase. Patient is to follow up with primary care provider or ENT if symptoms persist past this treatment course. Patient is given ED precautions to return to the ED for any worsening or Rodriguez symptoms.     ____________________________________________  FINAL CLINICAL IMPRESSION(S) / ED DIAGNOSES  Final diagnoses:   Barotitis media, initial encounter      [place] MEDICATIONS STARTED DURING THIS VISIT:  [facility] Prescriptions   CETIRIZINE (ZYRTEC) 10 MG TABLET    Take 1 tablet (10 mg total) by mouth daily.   FLUTICASONE (FLONASE) 50 MCG/ACT NASAL SPRAY    Place 1 spray into both nostrils 2 (two) times daily.        This chart was dictated using voice recognition software/Dragon. Despite best efforts to proofread, errors can occur which can change the meaning. Any change was purely unintentional.    Racheal PatchesJonathan D Danajah Birdsell, PA-C 08/04/15 1727  Jene Everyobert Kinner, MD 08/04/15 2242

## 2015-08-04 NOTE — ED Notes (Signed)
C/o left ear pain. Onset of symptoms this morning.

## 2015-08-04 NOTE — Discharge Instructions (Signed)

## 2015-11-28 ENCOUNTER — Emergency Department
Admission: EM | Admit: 2015-11-28 | Discharge: 2015-11-28 | Disposition: A | Discharge status: Home / Self Care | Payer: Self-pay | Attending: Emergency Medicine | Admitting: Emergency Medicine

## 2015-11-28 ENCOUNTER — Encounter: Payer: Self-pay | Admitting: Emergency Medicine

## 2015-11-28 ENCOUNTER — Emergency Department: Payer: Self-pay

## 2015-11-28 DIAGNOSIS — Z79899 Other long term (current) drug therapy: Secondary | ICD-10-CM | POA: Insufficient documentation

## 2015-11-28 DIAGNOSIS — F17200 Nicotine dependence, unspecified, uncomplicated: Secondary | ICD-10-CM | POA: Insufficient documentation

## 2015-11-28 DIAGNOSIS — Z7951 Long term (current) use of inhaled steroids: Secondary | ICD-10-CM | POA: Insufficient documentation

## 2015-11-28 DIAGNOSIS — R1011 Right upper quadrant pain: Secondary | ICD-10-CM | POA: Insufficient documentation

## 2015-11-28 DIAGNOSIS — R197 Diarrhea, unspecified: Secondary | ICD-10-CM | POA: Insufficient documentation

## 2015-11-28 LAB — COMPREHENSIVE METABOLIC PANEL
ALT: 30 U/L (ref 17–63)
AST: 28 U/L (ref 15–41)
Albumin: 4.5 g/dL (ref 3.5–5.0)
Alkaline Phosphatase: 107 U/L (ref 38–126)
Anion gap: 7 (ref 5–15)
BUN: 9 mg/dL (ref 6–20)
CHLORIDE: 104 mmol/L (ref 101–111)
CO2: 26 mmol/L (ref 22–32)
Calcium: 9.9 mg/dL (ref 8.9–10.3)
Creatinine, Ser: 1.02 mg/dL (ref 0.61–1.24)
Glucose, Bld: 99 mg/dL (ref 65–99)
POTASSIUM: 4.4 mmol/L (ref 3.5–5.1)
SODIUM: 137 mmol/L (ref 135–145)
Total Bilirubin: 0.7 mg/dL (ref 0.3–1.2)
Total Protein: 7.6 g/dL (ref 6.5–8.1)

## 2015-11-28 LAB — CBC
HCT: 46.8 % (ref 40.0–52.0)
HEMOGLOBIN: 16.5 g/dL (ref 13.0–18.0)
MCH: 33.2 pg (ref 26.0–34.0)
MCHC: 35.2 g/dL (ref 32.0–36.0)
MCV: 94.2 fL (ref 80.0–100.0)
PLATELETS: 234 10*3/uL (ref 150–440)
RBC: 4.97 MIL/uL (ref 4.40–5.90)
RDW: 13.4 % (ref 11.5–14.5)
WBC: 7.2 10*3/uL (ref 3.8–10.6)

## 2015-11-28 LAB — URINALYSIS COMPLETE WITH MICROSCOPIC (ARMC ONLY)
BILIRUBIN URINE: NEGATIVE
Bacteria, UA: NONE SEEN
GLUCOSE, UA: NEGATIVE mg/dL
HGB URINE DIPSTICK: NEGATIVE
KETONES UR: NEGATIVE mg/dL
Leukocytes, UA: NEGATIVE
NITRITE: NEGATIVE
Protein, ur: NEGATIVE mg/dL
RBC / HPF: NONE SEEN RBC/hpf (ref 0–5)
SPECIFIC GRAVITY, URINE: 1.013 (ref 1.005–1.030)
Squamous Epithelial / LPF: NONE SEEN
pH: 7 (ref 5.0–8.0)

## 2015-11-28 LAB — LIPASE, BLOOD: LIPASE: 22 U/L (ref 11–51)

## 2015-11-28 MED ORDER — RANITIDINE HCL 150 MG PO TABS: 150.0000 mg | ORAL_TABLET | Freq: Every day | ORAL | 0 refills | Status: DC

## 2015-11-28 NOTE — ED Provider Notes (Signed)
[facility] Emergency Department Provider Note  ____________________________________________  Time seen: Approximately 11:05 AM  I have reviewed the triage vital signs and the nursing notes.   HISTORY  Chief Complaint Abdominal Pain    HPI Jared Lawson is a 30 y.o. male , NAD, presents to the emergency department with 2 year history of abdominal pain. Patient states he has had right-sided and upper abdominal pain for approximately 2 years. Pain can worsen with eating certain meals such as spaghetti. Notes over the last couple of days he has had some mild nausea but no vomiting. Has also noted some watery diarrhea without mucus or blood. Denies any fevers, chills, body aches. No chest pain, shortness breath, numbness, weakness, tingling. Has not had any back pain. Denies any changes in urinary habits. Notes that he has not sought medical care for these symptoms until today.   History reviewed. No pertinent past medical history.  There are no active problems to display for this patient.   History reviewed. No pertinent surgical history.  Current Outpatient Rx  . Order #: [id number] Class: Print  . Order #: [id number] Class: Print  . Order #: [id number] Class: Print  . Order #: [id number] Class: Historical Med  . Order #: [id number] Class: Print  . Order #: [id number] Class: Print  . Order #: [id number] Class: Print  . Order #: [id number] Class: Print  . Order #: [id number] Class: Historical Med  . Order #: [id number] Class: Historical Med  . Order #: [id number] Class: Print    Allergies Review of patient's allergies indicates no known allergies.  No family history on file.  Social History Social History  Substance Use Topics  . Smoking status: Current Some Day Smoker  . Smokeless tobacco: Never Used  . Alcohol use No     Review of Systems  Constitutional: No fever/chills, Fatigue Cardiovascular: No chest pain, palpitations. Respiratory: No shortness of breath. No  wheezing.  Gastrointestinal: Positive abdominal pain and nausea, diarrhea.  No vomiting, constipation, hematemesis, mucus in the stool, hematochezia Genitourinary: Negative for dysuria, hematuria. No urinary hesitancy, urgency or increased frequency. Musculoskeletal: Negative for back pain or other general myalgias.  Skin: Negative for rash. Neurological: Negative for headaches, focal weakness or numbness. No saddle paresthesias, loss of bowel or bladder control, tingling. 10-point ROS otherwise negative.  ____________________________________________   PHYSICAL EXAM:  VITAL SIGNS: ED Triage Vitals  Enc Vitals Group     BP 11/28/15 0854 131/88     Pulse Rate 11/28/15 0854 92     Resp 11/28/15 0854 16     Temp 11/28/15 0854 97.4 F (36.3 C)     Temp Source 11/28/15 0854 Oral     SpO2 11/28/15 0854 100 %     Weight 11/28/15 0854 175 lb (79.4 kg)     Height 11/28/15 0854 5\' 8"  (1.727 m)     Head Circumference --      Peak Flow --      Pain Score 11/28/15 0846 7     Pain Loc --      Pain Edu? --      Excl. in GC? --      Constitutional: Alert and oriented. Well appearing and in no acute distress. Eyes: Conjunctivae are normal.  Head: Atraumatic. Neck: Supple with full range of motion Hematological/Lymphatic/Immunilogical: No cervical lymphadenopathy. Cardiovascular: Normal rate, regular rhythm. Normal S1 and S2.  Good peripheral circulation. Respiratory: Normal respiratory effort without tachypnea or retractions. Lungs CTAB with breath sounds noted in all lung fields. Gastrointestinal: Tenderness to deep palpation  in the right upper quadrant but the area is soft without distention or guarding. All other quadrants of the abdomen are soft and nontender without distention or guarding. No rigidity or rebound tenderness. No CVA tenderness.  Musculoskeletal: No lower extremity tenderness nor edema.  No joint effusions. Neurologic:  Normal speech and language. No gross focal neurologic  deficits are appreciated.  Skin:  Skin is warm, dry and intact. No rash noted. Psychiatric: Mood and affect are normal. Speech and behavior are normal. Patient exhibits appropriate insight and judgement.   ____________________________________________   LABS (all labs ordered are listed, but only abnormal results are displayed)  Labs Reviewed  URINALYSIS COMPLETEWITH MICROSCOPIC ([facility] ONLY) - Abnormal; Notable for the following:       Result Value   Color, Urine YELLOW (*)    APPearance CLEAR (*)    All other components within normal limits  LIPASE, BLOOD  COMPREHENSIVE METABOLIC PANEL  CBC   ____________________________________________  EKG  None ____________________________________________  RADIOLOGY I have personally viewed and evaluated these images (plain radiographs) as part of my medical decision making, as well as reviewing the written report by the radiologist.  US Abdomen Limited Ruq  Result Date: 11/28/2015 CLINICAL DATA:  30 year old male with right upper quadrant pain for years. Nausea since this morning. Initial encounter. EXAM: US ABDOMEN LIMITED - RIGHT UPPER QUADRANT COMPARISON:  04/17/2008 CT. FINDINGS: Gallbladder: No gallstones or wall thickening visualized. No sonographic Murphy sign noted by sonographer. Common bile duct: Diameter: 3.7 mm Liver: No focal lesion identified. Within normal limits in parenchymal echogenicity. IMPRESSION: Negative right upper quadrant ultrasound. Electronically Signed   By: Lacy Duverney M.D.   On: 11/28/2015 11:00   ____________________________________________    PROCEDURES  Procedure(s) performed: None      Medications - No data to display   ____________________________________________   INITIAL IMPRESSION / ASSESSMENT AND PLAN / ED COURSE  Pertinent labs & imaging results that were available during my care of the patient were reviewed by me and considered in my medical decision making (see chart for  details).  Patient's diagnosis is consistent with right upper quadrant abdominal pain and diarrhea. Lab work and imaging were negative for any acute causes of the patient's current abdominal pain. Advise that he monitor his diet and will give a prescription for an antacid considering patient has had increased pain after heavy meals. Patient will be discharged home with prescriptions for ranitidine to take as directed. Patient encouraged to keep a journal in regards to when he has abdominal pain and associated symptoms or food or drinks when it occurs. Patient is to follow up with Dr. Markham Jordan in gastroenterology for recheck and further evaluation and treatment. Patient is given ED precautions to return to the ED for any worsening or Capes symptoms.      ____________________________________________  FINAL CLINICAL IMPRESSION(S) / ED DIAGNOSES  Final diagnoses:  RUQ abdominal pain  Diarrhea, unspecified type      [place] MEDICATIONS STARTED DURING THIS VISIT:  [place] Prescriptions   RANITIDINE (ZANTAC) 150 MG TABLET    Take 1 tablet (150 mg total) by mouth at bedtime.         Hope Pigeon, PA-C 11/28/15 1117    Phineas Semen, MD 11/28/15 1158

## 2015-11-28 NOTE — ED Triage Notes (Signed)
Patient presents to the ED with intermittent abdominal cramping x several months.  Patient denies seeking medical care prior to today.  Patient denies vomiting reports occasional diarrhea.  Patient is in no obvious distress at this time.

## 2015-11-28 NOTE — ED Notes (Signed)
See triage note, states he developed pain to right lateral and upper quad about 1 year ago  States pain is intermittent with occasional n/v /d   No fever  But staes pain has moved to mid abd 1-2 days ago  NAD noted on arrival

## 2017-04-01 ENCOUNTER — Encounter (HOSPITAL_COMMUNITY): Payer: Self-pay | Admitting: Emergency Medicine

## 2017-04-01 ENCOUNTER — Other Ambulatory Visit: Payer: Self-pay

## 2017-04-01 ENCOUNTER — Ambulatory Visit (HOSPITAL_COMMUNITY)
Admission: EM | Admit: 2017-04-01 | Discharge: 2017-04-01 | Disposition: A | Discharge status: Home / Self Care | Payer: Self-pay | Attending: Urgent Care | Admitting: Urgent Care

## 2017-04-01 DIAGNOSIS — J069 Acute upper respiratory infection, unspecified: Secondary | ICD-10-CM

## 2017-04-01 DIAGNOSIS — J029 Acute pharyngitis, unspecified: Secondary | ICD-10-CM

## 2017-04-01 DIAGNOSIS — R0789 Other chest pain: Secondary | ICD-10-CM

## 2017-04-01 DIAGNOSIS — R0602 Shortness of breath: Secondary | ICD-10-CM

## 2017-04-01 DIAGNOSIS — R05 Cough: Secondary | ICD-10-CM

## 2017-04-01 DIAGNOSIS — R059 Cough, unspecified: Secondary | ICD-10-CM

## 2017-04-01 DIAGNOSIS — F17200 Nicotine dependence, unspecified, uncomplicated: Secondary | ICD-10-CM

## 2017-04-01 DIAGNOSIS — B9789 Other viral agents as the cause of diseases classified elsewhere: Secondary | ICD-10-CM

## 2017-04-01 MED ORDER — HYDROCOD POLST-CPM POLST ER 10-8 MG/5ML PO SUER: 5.0000 mL | Freq: Every evening | ORAL | 0 refills | Status: DC | PRN

## 2017-04-01 MED ORDER — ALBUTEROL SULFATE HFA 108 (90 BASE) MCG/ACT IN AERS: 1.0000 | INHALATION_SPRAY | Freq: Four times a day (QID) | RESPIRATORY_TRACT | 0 refills | Status: DC | PRN

## 2017-04-01 MED ORDER — PSEUDOEPHEDRINE HCL ER 120 MG PO TB12: 120.0000 mg | ORAL_TABLET | Freq: Two times a day (BID) | ORAL | 3 refills | Status: DC

## 2017-04-01 MED ORDER — BENZONATATE 100 MG PO CAPS: 100.0000 mg | ORAL_CAPSULE | Freq: Three times a day (TID) | ORAL | 0 refills | Status: DC | PRN

## 2017-04-01 MED ORDER — PREDNISONE 20 MG PO TABS: ORAL_TABLET | ORAL | 0 refills | Status: DC

## 2017-04-01 MED ORDER — AZITHROMYCIN 250 MG PO TABS: ORAL_TABLET | ORAL | 0 refills | Status: DC

## 2017-04-01 NOTE — ED Notes (Signed)
Triage assessment by octavia richard, rma 

## 2017-04-01 NOTE — Discharge Instructions (Addendum)
For sore throat try using a honey-based tea. Use 3 teaspoons of honey with juice squeezed from half lemon. Place shaved pieces of ginger into 1/2-1 cup of water and warm over stove top. Then mix the ingredients and repeat every 4 hours as needed.  Wait until about Friday to fill a script for the antibiotic, azithromycin, and the steroid, prednisone. You may get better with just scheduling your albuterol inhaler, hydrating well, using cough suppressing medications and not smoking. However, if you end up feeling better and your cough persists for 2-3 weeks and is very troublesome to you, then you can try the steroid course to put a stop to your cough. You are always welcome come back to the clinic for a recheck.

## 2017-04-01 NOTE — ED Triage Notes (Signed)
Pt c/o of cough. Per pt it started 1 week ago. Per pt he noticed the color was light green and is having some drainage..../or

## 2017-04-01 NOTE — ED Provider Notes (Signed)
   MRN: [id number] DOB: 1985/07/08  Subjective:   Jared Lawson is a 31 y.o. male presenting for 1 week history of worsening productive cough, now having chest pain, shob, wheezing. Has also had runny nose, sore throat, sinus headache, left ear discomfort (now resolved). Has tried otc Robitussin with minimal relief. Denies fever, sinus pain, ear drainage, n/v, abdominal pain. Denies history of allergies, asthma. Smokes 1.5ppd.   Jared Lawson is not currently taking any medications and has No Known Allergies.  Jared Lawson denies past medical and surgical history.   Objective:   Vitals: BP 130/86 (BP Location: Right Arm)   Pulse 91   Temp 98 F (36.7 C) (Oral)   Resp (!) 24   SpO2 99%   Physical Exam  Constitutional: He is oriented to person, place, and time. He appears well-developed and well-nourished.  HENT:  TM's intact bilaterally, no effusions or erythema. Nasal turbinates dry and erythematous, nasal passages patent. No sinus tenderness. Oropharynx with significant post-nasal drainage, mucous membranes moist.   Eyes: Right eye exhibits no discharge. Left eye exhibits no discharge.  Neck: Normal range of motion. Neck supple.  Cardiovascular: Normal rate, regular rhythm and intact distal pulses. Exam reveals no gallop and no friction rub.  No murmur heard. Pulmonary/Chest: No respiratory distress. He has no wheezes. He has no rales.  Diminished lung sounds in bibasilar fields.  Lymphadenopathy:    He has no cervical adenopathy.  Neurological: He is alert and oriented to person, place, and time.  Skin: Skin is warm and dry.  Psychiatric: He has a normal mood and affect.   Assessment and Plan :   Cough  Sore throat  Atypical chest pain  Shortness of breath  Viral URI with cough  Tobacco use disorder   Start supportive care for viral URI with cough worsened by his smoking. Schedule albuterol inhaler, use cough suppression medications, hydrate well. Parameters given for using  azithromycin and steroid course. Return-to-clinic precautions discussed, patient verbalized understanding.   Wallis BambergMario Roselind Klus, PA-C [facility]  04/01/2017  10:18 AM    Wallis BambergMani, Roxanne Panek, PA-C 04/01/17 1106

## 2017-04-19 ENCOUNTER — Other Ambulatory Visit: Payer: Self-pay | Admitting: Urgent Care

## 2017-05-05 ENCOUNTER — Other Ambulatory Visit: Payer: Self-pay

## 2017-05-05 ENCOUNTER — Encounter: Payer: Self-pay | Admitting: Emergency Medicine

## 2017-05-05 ENCOUNTER — Emergency Department
Admission: EM | Admit: 2017-05-05 | Discharge: 2017-05-05 | Disposition: A | Discharge status: Home / Self Care | Payer: 59 | Attending: Emergency Medicine | Admitting: Emergency Medicine

## 2017-05-05 DIAGNOSIS — Z20828 Contact with and (suspected) exposure to other viral communicable diseases: Secondary | ICD-10-CM | POA: Diagnosis not present

## 2017-05-05 DIAGNOSIS — R5383 Other fatigue: Secondary | ICD-10-CM | POA: Insufficient documentation

## 2017-05-05 DIAGNOSIS — F17210 Nicotine dependence, cigarettes, uncomplicated: Secondary | ICD-10-CM | POA: Diagnosis not present

## 2017-05-05 DIAGNOSIS — M791 Myalgia, unspecified site: Secondary | ICD-10-CM | POA: Diagnosis not present

## 2017-05-05 DIAGNOSIS — J111 Influenza due to unidentified influenza virus with other respiratory manifestations: Secondary | ICD-10-CM | POA: Diagnosis not present

## 2017-05-05 DIAGNOSIS — Z79899 Other long term (current) drug therapy: Secondary | ICD-10-CM | POA: Insufficient documentation

## 2017-05-05 DIAGNOSIS — R51 Headache: Secondary | ICD-10-CM | POA: Insufficient documentation

## 2017-05-05 DIAGNOSIS — R05 Cough: Secondary | ICD-10-CM | POA: Diagnosis present

## 2017-05-05 MED ORDER — OSELTAMIVIR PHOSPHATE 75 MG PO CAPS: 75.0000 mg | ORAL_CAPSULE | Freq: Two times a day (BID) | ORAL | 0 refills | Status: AC

## 2017-05-05 MED ORDER — ALBUTEROL SULFATE HFA 108 (90 BASE) MCG/ACT IN AERS: 2.0000 | INHALATION_SPRAY | Freq: Four times a day (QID) | RESPIRATORY_TRACT | 0 refills | Status: DC | PRN

## 2017-05-05 MED ORDER — FLUTICASONE PROPIONATE 50 MCG/ACT NA SUSP: 2.0000 | Freq: Every day | NASAL | 0 refills | Status: DC

## 2017-05-05 MED ORDER — BENZONATATE 100 MG PO CAPS: 100.0000 mg | ORAL_CAPSULE | Freq: Three times a day (TID) | ORAL | 0 refills | Status: AC | PRN

## 2017-05-05 NOTE — ED Triage Notes (Signed)
Patient complaining head congestion, cough and body aches since last night. Denies fever at home. Reports his wife and son were diagnosed with the flu Thursday.

## 2017-05-05 NOTE — ED Provider Notes (Signed)
[facility] Emergency Department Provider Note  ____________________________________________  Time seen: Approximately 3:29 PM  I have reviewed the triage vital signs and the nursing notes.   HISTORY  Chief Complaint Generalized Body Aches    HPI Jared Lawson is a 31 y.o. male presents emergency department for evaluation of headache, head congestion, cough, fatigue, body aches since last night.  He is coughing up mucus.  His wife and son were seen in the emergency department on Thursday and tested positive for flu.  Patient is trying to drink water but is been drinking more [facility].  He smokes a pack of cigarettes per day.  He has been given an inhaler and cough medicine from urgent care previously, which has helped when he had a cough.  He denies shortness of breath, chest pain, nausea, vomiting, abdominal pain, diarrhea, constipation.  History reviewed. No pertinent past medical history.  There are no active problems to display for this patient.   History reviewed. No pertinent surgical history.  Prior to Admission medications   Medication Sig Start Date End Date Taking? Authorizing Provider  albuterol (PROVENTIL HFA;VENTOLIN HFA) 108 (90 Base) MCG/ACT inhaler Inhale 2 puffs into the lungs every 6 (six) hours as needed for wheezing or shortness of breath. 05/05/17   Enid DerryWagner, Dmarco Baldus, PA-C  azithromycin (ZITHROMAX) 250 MG tablet Start with 2 tablets today, then 1 daily thereafter. 04/01/17   Wallis BambergMani, Mario, PA-C  benzonatate (TESSALON PERLES) 100 MG capsule Take 1 capsule (100 mg total) by mouth 3 (three) times daily as needed for cough. 05/05/17 05/05/18  Enid DerryWagner, Aerith Canal, PA-C  cetirizine (ZYRTEC) 10 MG tablet Take 1 tablet (10 mg total) by mouth daily. 08/04/15   Cuthriell, Delorise RoyalsJonathan D, PA-C  chlorpheniramine-HYDROcodone (TUSSIONEX PENNKINETIC ER) 10-8 MG/5ML SUER Take 5 mLs by mouth at bedtime as needed for cough. 04/01/17   Wallis BambergMani, Mario, PA-C   Dextromethorphan-Guaifenesin (ROBITUSSIN DM PO) Take by mouth.    [provider]  fluticasone (FLONASE) 50 MCG/ACT nasal spray Place 2 sprays into both nostrils daily. 05/05/17 05/05/18  Enid DerryWagner, Shalynn Jorstad, PA-C  HEXYLRESORCINOL, ANTISEPTIC, (SUCRETS) 2.4 MG LOZG Use as directed 1 lozenge in the mouth or throat every 4 (four) hours as needed. For sore throat    [provider]  ibuprofen (ADVIL,MOTRIN) 800 MG tablet Take 1 tablet (800 mg total) by mouth every 8 (eight) hours as needed. 01/17/15   Emily FilbertWilliams, Jonathan E, MD  oseltamivir (TAMIFLU) 75 MG capsule Take 1 capsule (75 mg total) by mouth 2 (two) times daily for 5 days. 05/05/17 05/10/17  Enid DerryWagner, Kerrianne Jeng, PA-C  oxyCODONE-acetaminophen (ROXICET) 5-325 MG per tablet Take 1 tablet by mouth every 6 (six) hours as needed. 01/17/15   Emily FilbertWilliams, Jonathan E, MD  oxyCODONE-acetaminophen (ROXICET) 5-325 MG per tablet Take 1 tablet by mouth every 6 (six) hours as needed for severe pain. 01/21/15   Cuthriell, Delorise RoyalsJonathan D, PA-C  Phenyleph-CPM-DM-APAP (TYLENOL COLD HEAD CONGESTION PO) Take 2 tablets by mouth every 6 (six) hours as needed. For pain    [provider]  predniSONE (DELTASONE) 20 MG tablet Take 2 tablets daily with breakfast. 04/01/17   Wallis BambergMani, Mario, PA-C  pseudoephedrine (SUDAFED 12 HOUR) 120 MG 12 hr tablet Take 1 tablet (120 mg total) by mouth 2 (two) times daily. 04/01/17   Wallis BambergMani, Mario, PA-C  Pseudoephedrine-APAP-DM (DAYQUIL PO) Take 2 capsules by mouth every 6 (six) hours as needed. For cold symptoms    [provider]  ranitidine (ZANTAC) 150 MG tablet Take 1  tablet (150 mg total) by mouth at bedtime. 11/28/15   Hagler, Jami L, PA-C    Allergies Patient has no known allergies.  No family history on file.  Social History Social History   Tobacco Use  . Smoking status: Current Some Day Smoker  . Smokeless tobacco: Never Used  Substance Use Topics  . Alcohol use: No  . Drug use: Not on file     Review  of Systems  Eyes: No visual changes. No discharge. ENT: Positive for congestion and rhinorrhea. Cardiovascular: No chest pain. Respiratory: Positive for cough. No SOB. Gastrointestinal: No abdominal pain.  No nausea, no vomiting.  No diarrhea.  No constipation. Musculoskeletal: Positive for body aches Skin: Negative for rash, abrasions, lacerations, ecchymosis.   ____________________________________________   PHYSICAL EXAM:  VITAL SIGNS: ED Triage Vitals  Enc Vitals Group     BP 05/05/17 1349 (!) 138/94     Pulse Rate 05/05/17 1349 (!) 105     Resp 05/05/17 1349 16     Temp 05/05/17 1349 99.9 F (37.7 C)     Temp Source 05/05/17 1349 Oral     SpO2 05/05/17 1349 97 %     Weight 05/05/17 1352 185 lb (83.9 kg)     Height 05/05/17 1352 6' (1.829 m)     Head Circumference --      Peak Flow --      Pain Score 05/05/17 1349 10     Pain Loc --      Pain Edu? --      Excl. in GC? --      Constitutional: Alert and oriented. Well appearing and in no acute distress. Eyes: Conjunctivae are normal. PERRL. EOMI. No discharge. Head: Atraumatic. ENT: No frontal and maxillary sinus tenderness.      Ears: Tympanic membranes pearly gray with good landmarks. No discharge.      Nose: Mild congestion/rhinnorhea.      Mouth/Throat: Mucous membranes are moist. Oropharynx non-erythematous. Tonsils not enlarged. No exudates. Uvula midline. Neck: No stridor.   Hematological/Lymphatic/Immunilogical: No cervical lymphadenopathy. Cardiovascular: Normal rate, regular rhythm.  Good peripheral circulation. Respiratory: Normal respiratory effort without tachypnea or retractions. Lungs CTAB. Good air entry to the bases with no decreased or absent breath sounds. Gastrointestinal: Bowel sounds 4 quadrants. Soft and nontender to palpation. No guarding or rigidity. No palpable masses. No distention. Musculoskeletal: Full range of motion to all extremities. No gross deformities appreciated. Neurologic:   Normal speech and language. No gross focal neurologic deficits are appreciated.  Skin:  Skin is warm, dry and intact. No rash noted.   ____________________________________________   LABS (all labs ordered are listed, but only abnormal results are displayed)  Labs Reviewed - No data to display ____________________________________________  EKG   ____________________________________________  RADIOLOGY Influenza  No results found.  ____________________________________________    PROCEDURES  Procedure(s) performed:    Procedures    Medications - No data to display   ____________________________________________   INITIAL IMPRESSION / ASSESSMENT AND PLAN / ED COURSE  Pertinent labs & imaging results that were available during my care of the patient were reviewed by me and considered in my medical decision making (see chart for details).  Review of the [place] CSRS was performed in accordance of the [facility] prior to dispensing any controlled drugs.   Patient's diagnosis is consistent with influenza. Vital signs and exam are reassuring. Wife and son tested positive for influenza on 3 days ago so he will be treated for influenza. We discussed  doing a chest xray and are in agreement to hold off at this time. Patient was educated to drink more water and less [facility].  Patient should alternate tylenol and ibuprofen for fever. Patient feels comfortable going home. Patient will be discharged home with prescriptions for Tamiflu, Tessalon Perles, albuterol inhaler, Flonase. Patient is to follow up with PCP as needed or otherwise directed. Patient is given ED precautions to return to the ED for any worsening or Zaugg symptoms.     ____________________________________________  FINAL CLINICAL IMPRESSION(S) / ED DIAGNOSES  Final diagnoses:  Influenza      Marzella MEDICATIONS STARTED DURING THIS VISIT:  ED Discharge Orders        Ordered    oseltamivir (TAMIFLU) 75 MG capsule  2  times daily     05/05/17 1511    benzonatate (TESSALON PERLES) 100 MG capsule  3 times daily PRN     05/05/17 1511    albuterol (PROVENTIL HFA;VENTOLIN HFA) 108 (90 Base) MCG/ACT inhaler  Every 6 hours PRN     05/05/17 1511    fluticasone (FLONASE) 50 MCG/ACT nasal spray  Daily     05/05/17 1511          This chart was dictated using voice recognition software/Dragon. Despite best efforts to proofread, errors can occur which can change the meaning. Any change was purely unintentional.    Enid DerryWagner, Lesleyanne Politte, PA-C 05/05/17 1539    Minna AntisPaduchowski, Kevin, MD 05/07/17 [phone]

## 2018-04-14 ENCOUNTER — Emergency Department
Admission: EM | Admit: 2018-04-14 | Discharge: 2018-04-14 | Disposition: A | Discharge status: Home / Self Care | Payer: Self-pay | Attending: Emergency Medicine | Admitting: Emergency Medicine

## 2018-04-14 ENCOUNTER — Encounter: Payer: Self-pay | Admitting: Emergency Medicine

## 2018-04-14 ENCOUNTER — Other Ambulatory Visit: Payer: Self-pay

## 2018-04-14 ENCOUNTER — Emergency Department: Payer: Self-pay

## 2018-04-14 DIAGNOSIS — Z79899 Other long term (current) drug therapy: Secondary | ICD-10-CM | POA: Insufficient documentation

## 2018-04-14 DIAGNOSIS — F17200 Nicotine dependence, unspecified, uncomplicated: Secondary | ICD-10-CM | POA: Insufficient documentation

## 2018-04-14 DIAGNOSIS — N201 Calculus of ureter: Secondary | ICD-10-CM | POA: Insufficient documentation

## 2018-04-14 LAB — URINALYSIS, COMPLETE (UACMP) WITH MICROSCOPIC
Bacteria, UA: NONE SEEN
Bilirubin Urine: NEGATIVE
Glucose, UA: NEGATIVE mg/dL
Ketones, ur: NEGATIVE mg/dL
Nitrite: NEGATIVE
Protein, ur: NEGATIVE mg/dL
Specific Gravity, Urine: 1.009 (ref 1.005–1.030)
Squamous Epithelial / HPF: NONE SEEN (ref 0–5)
pH: 6 (ref 5.0–8.0)

## 2018-04-14 LAB — COMPREHENSIVE METABOLIC PANEL
ALT: 80 U/L — AB (ref 0–44)
AST: 29 U/L (ref 15–41)
Albumin: 4.8 g/dL (ref 3.5–5.0)
Alkaline Phosphatase: 111 U/L (ref 38–126)
Anion gap: 8 (ref 5–15)
BUN: 12 mg/dL (ref 6–20)
CHLORIDE: 103 mmol/L (ref 98–111)
CO2: 28 mmol/L (ref 22–32)
CREATININE: 1.05 mg/dL (ref 0.61–1.24)
Calcium: 9.4 mg/dL (ref 8.9–10.3)
GFR calc Af Amer: >60 mL/min (ref >60)
GLUCOSE: 93 mg/dL (ref 70–99)
Potassium: 3.5 mmol/L (ref 3.5–5.1)
SODIUM: 139 mmol/L (ref 135–145)
Total Bilirubin: 1 mg/dL (ref 0.3–1.2)
Total Protein: 8.1 g/dL (ref 6.5–8.1)

## 2018-04-14 LAB — CBC
HEMATOCRIT: 47.3 % (ref 39.0–52.0)
HEMOGLOBIN: 16.4 g/dL (ref 13.0–17.0)
MCH: 33.1 pg (ref 26.0–34.0)
MCHC: 34.7 g/dL (ref 30.0–36.0)
MCV: 95.6 fL (ref 80.0–100.0)
Platelets: 285 10*3/uL (ref 150–400)
RBC: 4.95 MIL/uL (ref 4.22–5.81)
RDW: 11.7 % (ref 11.5–15.5)
WBC: 10.7 10*3/uL — ABNORMAL HIGH (ref 4.0–10.5)
nRBC: 0 % (ref 0.0–0.2)

## 2018-04-14 LAB — LIPASE, BLOOD: LIPASE: 40 U/L (ref 11–51)

## 2018-04-14 MED ORDER — MORPHINE SULFATE (PF) 4 MG/ML IV SOLN
INTRAVENOUS | Status: AC
  Filled 2018-04-14: qty 1

## 2018-04-14 MED ORDER — ONDANSETRON 4 MG PO TBDP: 4.0000 mg | ORAL_TABLET | Freq: Three times a day (TID) | ORAL | 0 refills | Status: DC | PRN

## 2018-04-14 MED ORDER — TAMSULOSIN HCL 0.4 MG PO CAPS: 0.4000 mg | ORAL_CAPSULE | Freq: Every day | ORAL | 0 refills | Status: DC

## 2018-04-14 MED ORDER — MORPHINE SULFATE (PF) 4 MG/ML IV SOLN
4.0000 mg | Freq: Once | INTRAVENOUS | Status: AC
  Administered 2018-04-14: 4 mg via INTRAVENOUS

## 2018-04-14 MED ORDER — NAPROXEN 500 MG PO TABS: 500.0000 mg | ORAL_TABLET | Freq: Two times a day (BID) | ORAL | 2 refills | Status: DC

## 2018-04-14 NOTE — ED Notes (Signed)
Pt signed esignature.  Iv d'ced.  Pt alert. Family with pt.

## 2018-04-14 NOTE — ED Triage Notes (Signed)
Lower abdominal pain x 1 month with dysuria.

## 2018-04-14 NOTE — ED Notes (Signed)
Patient transported to CT 

## 2018-04-14 NOTE — ED Provider Notes (Signed)
[facility] Emergency Department Provider Note   ____________________________________________    I have reviewed the triage vital signs and the nursing notes.   HISTORY  Chief Complaint Abdominal Pain     HPI Jared Lawson is a 32 y.o. male who presents with complaints of right-sided flank pain, patient reports pain is been intermittent over the last month.  He also reports that he has some burning after urinating and occasionally notices some hematuria.  Denies a history of kidney stones.  No Cassin sexual partners.  No penile discharge.  No nausea or vomiting.  No scrotal swelling.   History reviewed. No pertinent past medical history.  There are no active problems to display for this patient.   History reviewed. No pertinent surgical history.  Prior to Admission medications   Medication Sig Start Date End Date Taking? Authorizing Provider  albuterol (PROVENTIL HFA;VENTOLIN HFA) 108 (90 Base) MCG/ACT inhaler Inhale 2 puffs into the lungs Lawson 6 (six) hours as needed for wheezing or shortness of breath. 05/05/17   Enid DerryWagner, Ashley, PA-C  azithromycin (ZITHROMAX) 250 MG tablet Start with 2 tablets today, then 1 daily thereafter. 04/01/17   Wallis BambergMani, Mario, PA-C  benzonatate (TESSALON PERLES) 100 MG capsule Take 1 capsule (100 mg total) by mouth 3 (three) times daily as needed for cough. 05/05/17 05/05/18  Enid DerryWagner, Ashley, PA-C  cetirizine (ZYRTEC) 10 MG tablet Take 1 tablet (10 mg total) by mouth daily. 08/04/15   Cuthriell, Delorise RoyalsJonathan D, PA-C  chlorpheniramine-HYDROcodone (TUSSIONEX PENNKINETIC ER) 10-8 MG/5ML SUER Take 5 mLs by mouth at bedtime as needed for cough. 04/01/17   Wallis BambergMani, Mario, PA-C  Dextromethorphan-Guaifenesin (ROBITUSSIN DM PO) Take by mouth.    [provider]  fluticasone (FLONASE) 50 MCG/ACT nasal spray Place 2 sprays into both nostrils daily. 05/05/17 05/05/18  Enid DerryWagner, Ashley, PA-C  HEXYLRESORCINOL, ANTISEPTIC, (SUCRETS) 2.4 MG LOZG  Use as directed 1 lozenge in the mouth or throat Lawson 4 (four) hours as needed. For sore throat    [provider]  ibuprofen (ADVIL,MOTRIN) 800 MG tablet Take 1 tablet (800 mg total) by mouth Lawson 8 (eight) hours as needed. 01/17/15   Emily FilbertWilliams, Jonathan E, MD  naproxen (NAPROSYN) 500 MG tablet Take 1 tablet (500 mg total) by mouth 2 (two) times daily with a meal. 04/14/18   Jared EveryKinner, Icel Castles, MD  ondansetron (ZOFRAN ODT) 4 MG disintegrating tablet Take 1 tablet (4 mg total) by mouth Lawson 8 (eight) hours as needed for nausea or vomiting. 04/14/18   Jared EveryKinner, Daneesha Quinteros, MD  oxyCODONE-acetaminophen (ROXICET) 5-325 MG per tablet Take 1 tablet by mouth Lawson 6 (six) hours as needed. 01/17/15   Emily FilbertWilliams, Jonathan E, MD  oxyCODONE-acetaminophen (ROXICET) 5-325 MG per tablet Take 1 tablet by mouth Lawson 6 (six) hours as needed for severe pain. 01/21/15   Cuthriell, Delorise RoyalsJonathan D, PA-C  Phenyleph-CPM-DM-APAP (TYLENOL COLD HEAD CONGESTION PO) Take 2 tablets by mouth Lawson 6 (six) hours as needed. For pain    [provider]  predniSONE (DELTASONE) 20 MG tablet Take 2 tablets daily with breakfast. 04/01/17   Wallis BambergMani, Mario, PA-C  pseudoephedrine (SUDAFED 12 HOUR) 120 MG 12 hr tablet Take 1 tablet (120 mg total) by mouth 2 (two) times daily. 04/01/17   Wallis BambergMani, Mario, PA-C  Pseudoephedrine-APAP-DM (DAYQUIL PO) Take 2 capsules by mouth Lawson 6 (six) hours as needed. For cold symptoms    [provider]  ranitidine (ZANTAC) 150 MG tablet Take 1 tablet (150 mg total) by mouth at bedtime.  11/28/15   Hagler, Jami L, PA-C  tamsulosin (FLOMAX) 0.4 MG CAPS capsule Take 1 capsule (0.4 mg total) by mouth daily. 04/14/18   Jared Every, MD     Allergies Patient has no known allergies.  No family history on file.  Social History Social History   Tobacco Use  . Smoking status: Current Some Day Smoker  . Smokeless tobacco: Never Used  Substance Use Topics  . Alcohol use: No  . Drug use: Not on file     Review of Systems  Constitutional: No fever/chills Eyes: No visual changes.  ENT: No sore throat. Cardiovascular: Denies chest pain. Respiratory: Denies shortness of breath. Gastrointestinal: As above Genitourinary: As above Musculoskeletal: Negative for back pain. Skin: Negative for rash. Neurological: Negative for headaches or weakness   ____________________________________________   PHYSICAL EXAM:  VITAL SIGNS: ED Triage Vitals  Enc Vitals Group     BP 04/14/18 1651 139/90     Pulse Rate 04/14/18 1651 97     Resp 04/14/18 1651 20     Temp 04/14/18 1651 97.9 F (36.6 C)     Temp Source 04/14/18 1651 Oral     SpO2 04/14/18 1651 98 %     Weight 04/14/18 1653 95.3 kg (210 lb)     Height 04/14/18 1653 1.829 m (6')     Head Circumference --      Peak Flow --      Pain Score 04/14/18 1653 5     Pain Loc --      Pain Edu? --      Excl. in GC? --     Constitutional: Alert and oriented. No acute distress. Pleasant and interactive Eyes: Conjunctivae are normal.   Nose: No congestion/rhinnorhea. Mouth/Throat: Mucous membranes are moist.    Cardiovascular: Normal rate, regular rhythm. Grossly normal heart sounds.  Good peripheral circulation. Respiratory: Normal respiratory effort.  No retractions.  Gastrointestinal: Soft and nontender. No distention.  Reassuring exam Genitourinary: No discharge, no scrotal swelling Musculoskeletal: No lower extremity tenderness nor edema.  Warm and well perfused Neurologic:  . No gross focal neurologic deficits are appreciated.  Skin:  Skin is warm, dry and intact. No rash noted. Psychiatric: Mood and affect are normal. Speech and behavior are normal.  ____________________________________________   LABS (all labs ordered are listed, but only abnormal results are displayed)  Labs Reviewed  COMPREHENSIVE METABOLIC PANEL - Abnormal; Notable for the following components:      Result Value   ALT 80 (*)    All other components  within normal limits  CBC - Abnormal; Notable for the following components:   WBC 10.7 (*)    All other components within normal limits  URINALYSIS, COMPLETE (UACMP) WITH MICROSCOPIC - Abnormal; Notable for the following components:   Color, Urine YELLOW (*)    APPearance CLEAR (*)    Hgb urine dipstick MODERATE (*)    Leukocytes, UA TRACE (*)    All other components within normal limits  LIPASE, BLOOD   ____________________________________________  EKG  None ____________________________________________  RADIOLOGY  CT renal stone study ____________________________________________   PROCEDURES  Procedure(s) performed: No  Procedures   Critical Care performed: No ____________________________________________   INITIAL IMPRESSION / ASSESSMENT AND PLAN / ED COURSE  Pertinent labs & imaging results that were available during my care of the patient were reviewed by me and considered in my medical decision making (see chart for details).  Patient reports intermittent flank pain for nearly a month, urinalysis does demonstrate  hemoglobin, trace leukocytes and white blood cells suspicious for inflammation given no bacteria.  No penile discharge to suggest STD.  Will send for CT renal stone study given flank pain  CT consistent with 4 mm UVJ stone, discussed with Dr. Sande Brothers of urology, recommends outpatient follow-up, no indication for antibiotics.    ____________________________________________   FINAL CLINICAL IMPRESSION(S) / ED DIAGNOSES  Final diagnoses:  Ureterolithiasis        Note:  This document was prepared using Dragon voice recognition software and may include unintentional dictation errors.    Jared Every, MD 04/14/18 [phone]

## 2018-08-20 ENCOUNTER — Emergency Department
Admission: EM | Admit: 2018-08-20 | Discharge: 2018-08-20 | Disposition: A | Discharge status: Home / Self Care | Payer: No Typology Code available for payment source | Attending: Emergency Medicine | Admitting: Emergency Medicine

## 2018-08-20 ENCOUNTER — Other Ambulatory Visit: Payer: Self-pay

## 2018-08-20 ENCOUNTER — Emergency Department: Payer: No Typology Code available for payment source

## 2018-08-20 ENCOUNTER — Encounter: Payer: Self-pay | Admitting: Emergency Medicine

## 2018-08-20 DIAGNOSIS — Y999 Unspecified external cause status: Secondary | ICD-10-CM | POA: Diagnosis not present

## 2018-08-20 DIAGNOSIS — Y929 Unspecified place or not applicable: Secondary | ICD-10-CM | POA: Diagnosis not present

## 2018-08-20 DIAGNOSIS — S8992XA Unspecified injury of left lower leg, initial encounter: Secondary | ICD-10-CM | POA: Diagnosis present

## 2018-08-20 DIAGNOSIS — Z79899 Other long term (current) drug therapy: Secondary | ICD-10-CM | POA: Diagnosis not present

## 2018-08-20 DIAGNOSIS — Y9389 Activity, other specified: Secondary | ICD-10-CM | POA: Insufficient documentation

## 2018-08-20 DIAGNOSIS — F17200 Nicotine dependence, unspecified, uncomplicated: Secondary | ICD-10-CM | POA: Insufficient documentation

## 2018-08-20 DIAGNOSIS — S8012XA Contusion of left lower leg, initial encounter: Secondary | ICD-10-CM | POA: Diagnosis not present

## 2018-08-20 MED ORDER — CEPHALEXIN 500 MG PO CAPS: 500.0000 mg | ORAL_CAPSULE | Freq: Three times a day (TID) | ORAL | 0 refills | Status: AC

## 2018-08-20 MED ORDER — KETOROLAC TROMETHAMINE 30 MG/ML IJ SOLN
30.0000 mg | Freq: Once | INTRAMUSCULAR | Status: AC
  Administered 2018-08-20: 30 mg via INTRAMUSCULAR
  Filled 2018-08-20: qty 1

## 2018-08-20 MED ORDER — MELOXICAM 15 MG PO TABS: 15.0000 mg | ORAL_TABLET | Freq: Every day | ORAL | 1 refills | Status: AC
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
## 2019-01-06 ENCOUNTER — Other Ambulatory Visit: Payer: Self-pay

## 2019-01-06 ENCOUNTER — Emergency Department: Payer: Self-pay

## 2019-01-06 ENCOUNTER — Encounter: Payer: Self-pay | Admitting: Emergency Medicine

## 2019-01-06 DIAGNOSIS — Y999 Unspecified external cause status: Secondary | ICD-10-CM | POA: Insufficient documentation

## 2019-01-06 DIAGNOSIS — F17210 Nicotine dependence, cigarettes, uncomplicated: Secondary | ICD-10-CM | POA: Insufficient documentation

## 2019-01-06 DIAGNOSIS — Z79899 Other long term (current) drug therapy: Secondary | ICD-10-CM | POA: Insufficient documentation

## 2019-01-06 DIAGNOSIS — W2209XA Striking against other stationary object, initial encounter: Secondary | ICD-10-CM | POA: Insufficient documentation

## 2019-01-06 DIAGNOSIS — S9031XA Contusion of right foot, initial encounter: Secondary | ICD-10-CM | POA: Insufficient documentation

## 2019-01-06 DIAGNOSIS — Y9389 Activity, other specified: Secondary | ICD-10-CM | POA: Insufficient documentation

## 2019-01-06 DIAGNOSIS — Y929 Unspecified place or not applicable: Secondary | ICD-10-CM | POA: Insufficient documentation
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
## 2019-01-07 ENCOUNTER — Emergency Department: Payer: Self-pay

## 2019-01-07 ENCOUNTER — Emergency Department
Admission: EM | Admit: 2019-01-07 | Discharge: 2019-01-07 | Disposition: A | Discharge status: Home / Self Care | Payer: Self-pay | Attending: Emergency Medicine | Admitting: Emergency Medicine

## 2019-01-07 DIAGNOSIS — S9031XA Contusion of right foot, initial encounter: Secondary | ICD-10-CM

## 2019-01-07 MED ORDER — ACETAMINOPHEN 500 MG PO TABS
1000.0000 mg | ORAL_TABLET | Freq: Once | ORAL | Status: AC
  Administered 2019-01-07: 1000 mg via ORAL
  Filled 2019-01-07: qty 2

## 2019-01-07 MED ORDER — TRAMADOL HCL 50 MG PO TABS: 50.0000 mg | ORAL_TABLET | Freq: Four times a day (QID) | ORAL | 0 refills | Status: AC | PRN

## 2019-01-07 MED ORDER — KETOROLAC TROMETHAMINE 30 MG/ML IJ SOLN
30.0000 mg | Freq: Once | INTRAMUSCULAR | Status: AC
  Administered 2019-01-07: 30 mg via INTRAMUSCULAR
  Filled 2019-01-07: qty 1

## 2019-01-07 MED ORDER — OXYCODONE HCL 5 MG PO TABS
5.0000 mg | ORAL_TABLET | Freq: Once | ORAL | Status: AC
  Administered 2019-01-07: 5 mg via ORAL
  Filled 2019-01-07: qty 1

## 2019-01-07 NOTE — ED Notes (Signed)
Xray report reviewed. Awaiting room for MD eval. Patient visualized in lobby in no acute distress.  
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
## 2019-01-07 NOTE — Discharge Instructions (Addendum)
Use the crutches as needed.  Take Tylenol 1 g every 8 hours.  You combine that with ibuprofen 800 every 6 hours.  Use the tramadol for breakthrough pain only.  Return to er for fevers, worsening swelling, any other concerns

## 2019-01-07 NOTE — ED Notes (Signed)
Patient's R ankle wrapped with ACE wrap; provided with crutches

## 2023-04-02 ENCOUNTER — Ambulatory Visit: Payer: Medicaid Other | Admitting: Family

## 2023-04-02 ENCOUNTER — Encounter: Payer: Self-pay | Admitting: Family

## 2023-04-02 VITALS — BP 120/70 | HR 94 | Ht 70.0 in | Wt 215.2 lb

## 2023-04-02 DIAGNOSIS — Z125 Encounter for screening for malignant neoplasm of prostate: Secondary | ICD-10-CM

## 2023-04-02 DIAGNOSIS — Z114 Encounter for screening for human immunodeficiency virus [HIV]: Secondary | ICD-10-CM

## 2023-04-02 DIAGNOSIS — Z1159 Encounter for screening for other viral diseases: Secondary | ICD-10-CM

## 2023-04-02 DIAGNOSIS — E538 Deficiency of other specified B group vitamins: Secondary | ICD-10-CM

## 2023-04-02 DIAGNOSIS — I10 Essential (primary) hypertension: Secondary | ICD-10-CM

## 2023-04-02 DIAGNOSIS — F1010 Alcohol abuse, uncomplicated: Secondary | ICD-10-CM

## 2023-04-02 DIAGNOSIS — E559 Vitamin D deficiency, unspecified: Secondary | ICD-10-CM | POA: Diagnosis not present

## 2023-04-02 DIAGNOSIS — E782 Mixed hyperlipidemia: Secondary | ICD-10-CM

## 2023-04-02 DIAGNOSIS — R7303 Prediabetes: Secondary | ICD-10-CM

## 2023-04-02 DIAGNOSIS — R5383 Other fatigue: Secondary | ICD-10-CM

## 2023-04-06 LAB — CBC WITH DIFFERENTIAL/PLATELET
Basophils Absolute: 0.1 10*3/uL (ref 0.0–0.2)
Basos: 1 %
EOS (ABSOLUTE): 0.1 10*3/uL (ref 0.0–0.4)
Eos: 1 %
Hematocrit: 52.2 % — ABNORMAL HIGH (ref 37.5–51.0)
Hemoglobin: 17.7 g/dL (ref 13.0–17.7)
Immature Grans (Abs): 0.1 10*3/uL (ref 0.0–0.1)
Immature Granulocytes: 1 %
Lymphocytes Absolute: 1.6 10*3/uL (ref 0.7–3.1)
Lymphs: 17 %
MCH: 33.6 pg — ABNORMAL HIGH (ref 26.6–33.0)
MCHC: 33.9 g/dL (ref 31.5–35.7)
MCV: 99 fL — ABNORMAL HIGH (ref 79–97)
Monocytes Absolute: 0.5 10*3/uL (ref 0.1–0.9)
Monocytes: 5 %
Neutrophils Absolute: 7.3 10*3/uL — ABNORMAL HIGH (ref 1.4–7.0)
Neutrophils: 75 %
Platelets: 257 10*3/uL (ref 150–450)
RBC: 5.27 x10E6/uL (ref 4.14–5.80)
RDW: 11.7 % (ref 11.6–15.4)
WBC: 9.8 10*3/uL (ref 3.4–10.8)

## 2023-04-06 LAB — CMP14+EGFR
ALT: 39 [IU]/L (ref 0–44)
AST: 21 [IU]/L (ref 0–40)
Albumin: 4.9 g/dL (ref 4.1–5.1)
Alkaline Phosphatase: 128 [IU]/L — ABNORMAL HIGH (ref 44–121)
BUN/Creatinine Ratio: 7 — ABNORMAL LOW (ref 9–20)
BUN: 8 mg/dL (ref 6–20)
Bilirubin Total: 0.4 mg/dL (ref 0.0–1.2)
CO2: 22 mmol/L (ref 20–29)
Calcium: 9.8 mg/dL (ref 8.7–10.2)
Chloride: 101 mmol/L (ref 96–106)
Creatinine, Ser: 1.12 mg/dL (ref 0.76–1.27)
Globulin, Total: 2.4 g/dL (ref 1.5–4.5)
Glucose: 102 mg/dL — ABNORMAL HIGH (ref 70–99)
Potassium: 4.9 mmol/L (ref 3.5–5.2)
Sodium: 142 mmol/L (ref 134–144)
Total Protein: 7.3 g/dL (ref 6.0–8.5)
eGFR: 87 mL/min/{1.73_m2} (ref >59)

## 2023-04-06 LAB — LIPID PANEL
Chol/HDL Ratio: 3.8 {ratio} (ref 0.0–5.0)
Cholesterol, Total: 178 mg/dL (ref 100–199)
HDL: 47 mg/dL (ref >39)
LDL Chol Calc (NIH): 114 mg/dL — ABNORMAL HIGH (ref 0–99)
Triglycerides: 95 mg/dL (ref 0–149)
VLDL Cholesterol Cal: 17 mg/dL (ref 5–40)

## 2023-04-06 LAB — VITAMIN B1: Thiamine: 145.3 nmol/L (ref 66.5–200.0)

## 2023-04-06 LAB — FOLATE RBC
Folate, Hemolysate: 454 ng/mL
Folate, RBC: 870 ng/mL (ref >498)

## 2023-04-06 LAB — PSA: Prostate Specific Ag, Serum: 0.9 ng/mL (ref 0.0–4.0)

## 2023-04-06 LAB — HEMOGLOBIN A1C
Est. average glucose Bld gHb Est-mCnc: 111 mg/dL
Hgb A1c MFr Bld: 5.5 % (ref 4.8–5.6)

## 2023-04-06 LAB — HIV ANTIBODY (ROUTINE TESTING W REFLEX): HIV Screen 4th Generation wRfx: NONREACTIVE

## 2023-04-06 LAB — VITAMIN B12: Vitamin B-12: 400 pg/mL (ref 232–1245)

## 2023-04-06 LAB — TSH: TSH: 0.829 u[IU]/mL (ref 0.450–4.500)

## 2023-04-06 LAB — VITAMIN D 25 HYDROXY (VIT D DEFICIENCY, FRACTURES): Vit D, 25-Hydroxy: 32.8 ng/mL (ref 30.0–100.0)

## 2023-04-06 LAB — HCV AB W REFLEX TO QUANT PCR: HCV Ab: NONREACTIVE

## 2023-04-06 LAB — HCV INTERPRETATION

## 2023-04-18 ENCOUNTER — Ambulatory Visit: Payer: Medicaid Other | Admitting: Family

## 2023-04-18 ENCOUNTER — Encounter: Payer: Self-pay | Admitting: Family

## 2023-04-18 VITALS — BP 137/89 | HR 81 | Ht 70.0 in | Wt 217.0 lb

## 2023-04-18 DIAGNOSIS — I10 Essential (primary) hypertension: Secondary | ICD-10-CM

## 2023-04-18 DIAGNOSIS — E559 Vitamin D deficiency, unspecified: Secondary | ICD-10-CM

## 2023-04-18 DIAGNOSIS — F1010 Alcohol abuse, uncomplicated: Secondary | ICD-10-CM | POA: Diagnosis not present

## 2023-04-18 DIAGNOSIS — E782 Mixed hyperlipidemia: Secondary | ICD-10-CM | POA: Diagnosis not present

## 2023-04-18 MED ORDER — NALTREXONE HCL 50 MG PO TABS: 50.0000 mg | ORAL_TABLET | Freq: Every day | ORAL | 2 refills | Status: AC

## 2023-05-20 ENCOUNTER — Ambulatory Visit: Payer: Medicaid Other | Admitting: Family

## 2023-06-09 DIAGNOSIS — F1010 Alcohol abuse, uncomplicated: Secondary | ICD-10-CM | POA: Insufficient documentation

## 2023-06-09 NOTE — Progress Notes (Signed)
[facility] Patient Office Visit  Subjective  Patient ID: Jared Lawson, male    DOB: 12-26-1985  Age: 38 y.o. MRN: [id number]  CC:  Chief Complaint  Patient presents with   Establish Care    HPI Jared Lawson presents to establish care Previous Primary Care provider/office:   he does not have additional concerns to discuss today.    Outpatient Encounter Medications as of 04/02/2023  Medication Sig   ibuprofen (ADVIL) 100 MG chewable tablet Chew 100 mg by mouth as needed.   Melatonin 10 MG CHEW Chew 1 tablet by mouth at bedtime.   [DISCONTINUED] albuterol (PROVENTIL HFA;VENTOLIN HFA) 108 (90 Base) MCG/ACT inhaler Inhale 2 puffs into the lungs every 6 (six) hours as needed for wheezing or shortness of breath. (Patient not taking: Reported on 04/02/2023)   [DISCONTINUED] azithromycin (ZITHROMAX) 250 MG tablet Start with 2 tablets today, then 1 daily thereafter. (Patient not taking: Reported on 04/02/2023)   [DISCONTINUED] cetirizine (ZYRTEC) 10 MG tablet Take 1 tablet (10 mg total) by mouth daily. (Patient not taking: Reported on 04/02/2023)   [DISCONTINUED] chlorpheniramine-HYDROcodone (TUSSIONEX PENNKINETIC ER) 10-8 MG/5ML SUER Take 5 mLs by mouth at bedtime as needed for cough. (Patient not taking: Reported on 04/02/2023)   [DISCONTINUED] Dextromethorphan-Guaifenesin (ROBITUSSIN DM PO) Take by mouth. (Patient not taking: Reported on 04/02/2023)   [DISCONTINUED] fluticasone (FLONASE) 50 MCG/ACT nasal spray Place 2 sprays into both nostrils daily.   [DISCONTINUED] HEXYLRESORCINOL, ANTISEPTIC, (SUCRETS) 2.4 MG LOZG Use as directed 1 lozenge in the mouth or throat every 4 (four) hours as needed. For sore throat (Patient not taking: Reported on 04/02/2023)   [DISCONTINUED] ibuprofen (ADVIL,MOTRIN) 800 MG tablet Take 1 tablet (800 mg total) by mouth every 8 (eight) hours as needed. (Patient not taking: Reported on 04/02/2023)   [DISCONTINUED] naproxen (NAPROSYN) 500 MG tablet Take 1 tablet (500 mg  total) by mouth 2 (two) times daily with a meal. (Patient not taking: Reported on 04/02/2023)   [DISCONTINUED] ondansetron (ZOFRAN ODT) 4 MG disintegrating tablet Take 1 tablet (4 mg total) by mouth every 8 (eight) hours as needed for nausea or vomiting. (Patient not taking: Reported on 04/02/2023)   [DISCONTINUED] oxyCODONE-acetaminophen (ROXICET) 5-325 MG per tablet Take 1 tablet by mouth every 6 (six) hours as needed. (Patient not taking: Reported on 04/02/2023)   [DISCONTINUED] oxyCODONE-acetaminophen (ROXICET) 5-325 MG per tablet Take 1 tablet by mouth every 6 (six) hours as needed for severe pain. (Patient not taking: Reported on 04/02/2023)   [DISCONTINUED] Phenyleph-CPM-DM-APAP (TYLENOL COLD HEAD CONGESTION PO) Take 2 tablets by mouth every 6 (six) hours as needed. For pain (Patient not taking: Reported on 04/02/2023)   [DISCONTINUED] predniSONE (DELTASONE) 20 MG tablet Take 2 tablets daily with breakfast. (Patient not taking: Reported on 04/02/2023)   [DISCONTINUED] pseudoephedrine (SUDAFED 12 HOUR) 120 MG 12 hr tablet Take 1 tablet (120 mg total) by mouth 2 (two) times daily. (Patient not taking: Reported on 04/02/2023)   [DISCONTINUED] Pseudoephedrine-APAP-DM (DAYQUIL PO) Take 2 capsules by mouth every 6 (six) hours as needed. For cold symptoms (Patient not taking: Reported on 04/02/2023)   [DISCONTINUED] ranitidine (ZANTAC) 150 MG tablet Take 1 tablet (150 mg total) by mouth at bedtime. (Patient not taking: Reported on 04/02/2023)   [DISCONTINUED] tamsulosin (FLOMAX) 0.4 MG CAPS capsule Take 1 capsule (0.4 mg total) by mouth daily. (Patient not taking: Reported on 04/02/2023)   No facility-administered encounter medications on file as of 04/02/2023.    History reviewed. No pertinent past medical history.  History reviewed. No pertinent surgical history.  Family History  Problem Relation Age of Onset   Thyroid disease Mother    Cancer Father        uncertain type   Dementia Maternal  Grandfather        unsure of diagnosis, but did have behavioral changes before death   Cancer Paternal Grandmother        pancreatic    Social History   Socioeconomic History   Marital status: Married    Spouse name: Revonda Standard   Number of children: Not on file   Years of education: Not on file   Highest education level: Not on file  Occupational History   Occupation: SELF EMPLOYED  Tobacco Use   Smoking status: Every Day    Current packs/day: 1.00    Average packs/day: 1 pack/day for 15.1 years (15.1 ttl pk-yrs)    Types: Cigarettes    Start date: 2010   Smokeless tobacco: Never  Substance and Sexual Activity   Alcohol use: Yes    Alcohol/week: 35.0 standard drinks of alcohol    Types: 35 Cans of beer per week   Drug use: Not Currently   Sexual activity: Yes  Other Topics Concern   Not on file  Social History Narrative   Not on file   Social Drivers of Health   Financial Resource Strain: Not on file  Food Insecurity: Not on file  Transportation Needs: Not on file  Physical Activity: Not on file  Stress: Not on file  Social Connections: Not on file  Intimate Partner Violence: Not on file    Review of Systems  All other systems reviewed and are negative.       Objective   BP 120/70   Pulse 94   Ht 5\' 10"  (1.778 m)   Wt 215 lb 3.2 oz (97.6 kg)   SpO2 96%   BMI 30.88 kg/m   Physical Exam Vitals and nursing note reviewed.  Constitutional:      Appearance: Normal appearance. He is normal weight.  Eyes:     Pupils: Pupils are equal, round, and reactive to light.  Cardiovascular:     Rate and Rhythm: Normal rate and regular rhythm.     Pulses: Normal pulses.     Heart sounds: Normal heart sounds.  Pulmonary:     Effort: Pulmonary effort is normal.     Breath sounds: Normal breath sounds.  Neurological:     General: No focal deficit present.     Mental Status: He is alert and oriented to person, place, and time. Mental status is at baseline.   Psychiatric:        Mood and Affect: Mood normal.        Behavior: Behavior normal.        Thought Content: Thought content normal.        Judgment: Judgment normal.        Assessment & Plan:   Problem List Items Addressed This Visit       Other   Alcohol abuse   Relevant Orders   RBC Folate (Completed)   Vitamin B1 (Completed)   Other Visit Diagnoses       B12 deficiency due to diet    -  Primary   Checking labs today.  Will continue supplements as needed.   Relevant Orders   CMP14+EGFR (Completed)   Vitamin B12 (Completed)   CBC with Diff (Completed)     Prediabetes  A1C is in prediabetic ranges. Patient counseled on dietary choices and verbalized understanding. Will reassess at follow up after next lab check.   Relevant Orders   CMP14+EGFR (Completed)   Hemoglobin A1c (Completed)   CBC with Diff (Completed)     Vitamin D deficiency, unspecified       Checking labs today.  Will continue supplements as needed.   Relevant Orders   VITAMIN D 25 Hydroxy (Vit-D Deficiency, Fractures) (Completed)   CMP14+EGFR (Completed)   CBC with Diff (Completed)     Other fatigue       Relevant Orders   CMP14+EGFR (Completed)   TSH (Completed)   CBC with Diff (Completed)     Need for hepatitis C screening test       Test ordered in office today. Will call with results.   Relevant Orders   CMP14+EGFR (Completed)   CBC with Diff (Completed)   Hepatitis C Ab reflex to Quant PCR (Completed)     Screening for HIV without presence of risk factors       Test ordered in office today. Will call with results.   Relevant Orders   CMP14+EGFR (Completed)   CBC with Diff (Completed)   HIV antibody (with reflex) (Completed)     Mixed hyperlipidemia       Checking labs today.  Continue current therapy for lipid control. Will modify as needed based on labwork results.   Relevant Orders   Lipid panel (Completed)   CMP14+EGFR (Completed)   CBC with Diff (Completed)      Essential hypertension, benign       Blood pressure well controlled with current medications.  Continue current therapy.  Will reassess at follow up.   Relevant Orders   CMP14+EGFR (Completed)   CBC with Diff (Completed)     Screening for malignant neoplasm of prostate       Test ordered in office today. Will call with results.   Relevant Orders   PSA (Completed)       Return in about 2 weeks (around 04/16/2023) for F/U.   Total time spent: 30 minutes  Miki Kins, FNP  04/02/2023  This document may have been prepared by [facility] Voice Recognition software and as such may include unintentional dictation errors.

## 2023-07-07 ENCOUNTER — Encounter: Payer: Self-pay | Admitting: Family

## 2023-07-07 DIAGNOSIS — E782 Mixed hyperlipidemia: Secondary | ICD-10-CM | POA: Insufficient documentation

## 2023-07-07 DIAGNOSIS — E559 Vitamin D deficiency, unspecified: Secondary | ICD-10-CM | POA: Insufficient documentation

## 2023-07-07 NOTE — Assessment & Plan Note (Signed)
 Sending naltrexone for patient to use for his alcohol use.  Discussed the need to stop his drinking to prevent further health consequences.

## 2023-07-07 NOTE — Assessment & Plan Note (Signed)
 Discussed dietary/lifestyle changes to help with cholesterol control.  Repeat labs at follow up appointment.  Will modify as needed based on labwork results.

## 2023-07-07 NOTE — Progress Notes (Signed)
 Established Patient Office Visit  Subjective:  Patient ID: Jared Lawson, male    DOB: [birth year]  Age: 38 y.o. MRN: [id number]  Chief Complaint  Patient presents with   Follow-up    Pt. Here today for 2 week n/p follow up.   Had labs done at that time, so we will review in detail today.   Labs: Lipids elevated. Alk phos elevated as well.  Patient admits to heavy drinking.   No other concerns today.      No other concerns at this time.   History reviewed. No pertinent past medical history.  History reviewed. No pertinent surgical history.  Social History   Socioeconomic History   Marital status: Married    Spouse name: Revonda Standard   Number of children: Not on file   Years of education: Not on file   Highest education level: Not on file  Occupational History   Occupation: SELF EMPLOYED  Tobacco Use   Smoking status: Every Day    Current packs/day: 1.00    Average packs/day: 1 pack/day for 15.2 years (15.2 ttl pk-yrs)    Types: Cigarettes    Start date: 2010   Smokeless tobacco: Never  Substance and Sexual Activity   Alcohol use: Yes    Alcohol/week: 35.0 standard drinks of alcohol    Types: 35 Cans of beer per week   Drug use: Not Currently   Sexual activity: Yes  Other Topics Concern   Not on file  Social History Narrative   Not on file   Social Drivers of Health   Financial Resource Strain: Not on file  Food Insecurity: Not on file  Transportation Needs: Not on file  Physical Activity: Not on file  Stress: Not on file  Social Connections: Not on file  Intimate Partner Violence: Not on file    Family History  Problem Relation Age of Onset   Thyroid disease Mother    Cancer Father        uncertain type   Dementia Maternal Grandfather        unsure of diagnosis, but did have behavioral changes before death   Cancer Paternal Grandmother        pancreatic    No Known Allergies  Review of Systems  All other systems reviewed and are  negative.      Objective:   BP 137/89   Pulse 81   Ht 5\' 10"  (1.778 m)   Wt 217 lb (98.4 kg)   SpO2 98%   BMI 31.14 kg/m   Vitals:   04/18/23 1046  BP: 137/89  Pulse: 81  Height: 5\' 10"  (1.778 m)  Weight: 217 lb (98.4 kg)  SpO2: 98%  BMI (Calculated): 31.14    Physical Exam Vitals and nursing note reviewed.  Constitutional:      Appearance: Normal appearance. He is obese.  HENT:     Head: Normocephalic and atraumatic.  Eyes:     Extraocular Movements: Extraocular movements intact.     Conjunctiva/sclera: Conjunctivae normal.     Pupils: Pupils are equal, round, and reactive to light.  Cardiovascular:     Rate and Rhythm: Normal rate and regular rhythm.     Pulses: Normal pulses.     Heart sounds: Normal heart sounds.  Pulmonary:     Effort: Pulmonary effort is normal.     Breath sounds: Normal breath sounds.  Musculoskeletal:        General: Normal range of motion.     Cervical back:  Normal range of motion.  Neurological:     General: No focal deficit present.     Mental Status: He is alert.  Psychiatric:        Mood and Affect: Mood normal.        Behavior: Behavior normal.        Thought Content: Thought content normal.        Judgment: Judgment normal.      No results found for any visits on 04/18/23.  No results found for this or any previous visit (from the past 2160 hours).     Assessment & Plan:   Problem List Items Addressed This Visit       Other   Alcohol abuse - Primary   Sending naltrexone for patient to use for his alcohol use.  Discussed the need to stop his drinking to prevent further health consequences.       Mixed hyperlipidemia   Discussed dietary/lifestyle changes to help with cholesterol control.  Repeat labs at follow up appointment.  Will modify as needed based on labwork results.        Vitamin D deficiency, unspecified   Will continue supplements as needed.         Return in about 1 month (around  05/19/2023) for F/U.   Total time spent: 20 minutes  Miki Kins, FNP  04/18/2023   This document may have been prepared by [facility] Voice Recognition software and as such may include unintentional dictation errors.

## 2023-07-07 NOTE — Assessment & Plan Note (Signed)
 Will continue supplements as needed.
